# Patient Record
Sex: Male | Born: 1945 | Race: White | Hispanic: No | Marital: Single | Smoking: Former smoker
Health system: Southern US, Community
[De-identification: ages and names within clinical notes are randomized; demographics above are authoritative.]

## PROBLEM LIST (undated history)

## (undated) DIAGNOSIS — M1990 Unspecified osteoarthritis, unspecified site: Secondary | ICD-10-CM

## (undated) DIAGNOSIS — K219 Gastro-esophageal reflux disease without esophagitis: Secondary | ICD-10-CM

## (undated) DIAGNOSIS — E785 Hyperlipidemia, unspecified: Secondary | ICD-10-CM

## (undated) DIAGNOSIS — I10 Essential (primary) hypertension: Secondary | ICD-10-CM

## (undated) DIAGNOSIS — D229 Melanocytic nevi, unspecified: Secondary | ICD-10-CM

## (undated) DIAGNOSIS — K5792 Diverticulitis of intestine, part unspecified, without perforation or abscess without bleeding: Secondary | ICD-10-CM

## (undated) DIAGNOSIS — R42 Dizziness and giddiness: Secondary | ICD-10-CM

## (undated) DIAGNOSIS — N189 Chronic kidney disease, unspecified: Secondary | ICD-10-CM

## (undated) HISTORY — DX: Melanocytic nevi, unspecified: D22.9

## (undated) HISTORY — DX: Gastro-esophageal reflux disease without esophagitis: K21.9

## (undated) HISTORY — PX: APPENDECTOMY: SHX54

## (undated) HISTORY — DX: Unspecified osteoarthritis, unspecified site: M19.90

## (undated) HISTORY — DX: Chronic kidney disease, unspecified: N18.9

## (undated) HISTORY — DX: Essential (primary) hypertension: I10

## (undated) HISTORY — DX: Diverticulitis of intestine, part unspecified, without perforation or abscess without bleeding: K57.92

## (undated) HISTORY — DX: Dizziness and giddiness: R42

## (undated) HISTORY — DX: Hyperlipidemia, unspecified: E78.5

---

## 1999-01-15 ENCOUNTER — Encounter: Payer: Self-pay | Admitting: Emergency Medicine

## 1999-01-15 ENCOUNTER — Inpatient Hospital Stay (HOSPITAL_COMMUNITY): Admission: EM | Admit: 1999-01-15 | Discharge: 1999-01-16 | Payer: Self-pay | Admitting: Emergency Medicine

## 2000-08-22 ENCOUNTER — Encounter: Payer: Self-pay | Admitting: Emergency Medicine

## 2000-08-22 ENCOUNTER — Emergency Department (HOSPITAL_COMMUNITY): Admission: EM | Admit: 2000-08-22 | Discharge: 2000-08-22 | Payer: Self-pay | Admitting: Emergency Medicine

## 2001-04-07 ENCOUNTER — Encounter: Payer: Self-pay | Admitting: Neurosurgery

## 2001-04-07 ENCOUNTER — Encounter: Admission: RE | Admit: 2001-04-07 | Discharge: 2001-04-07 | Payer: Self-pay | Admitting: Neurosurgery

## 2001-05-04 ENCOUNTER — Other Ambulatory Visit: Admission: RE | Admit: 2001-05-04 | Discharge: 2001-05-04 | Payer: Self-pay | Admitting: Internal Medicine

## 2004-02-22 ENCOUNTER — Emergency Department (HOSPITAL_COMMUNITY): Admission: EM | Admit: 2004-02-22 | Discharge: 2004-02-22 | Payer: Self-pay

## 2004-11-19 ENCOUNTER — Ambulatory Visit: Payer: Self-pay | Admitting: Internal Medicine

## 2008-03-04 ENCOUNTER — Emergency Department (HOSPITAL_COMMUNITY): Admission: EM | Admit: 2008-03-04 | Discharge: 2008-03-04 | Payer: Self-pay | Admitting: Family Medicine

## 2011-06-22 DIAGNOSIS — D229 Melanocytic nevi, unspecified: Secondary | ICD-10-CM

## 2011-06-22 HISTORY — DX: Melanocytic nevi, unspecified: D22.9

## 2012-01-04 ENCOUNTER — Telehealth: Payer: Self-pay | Admitting: Internal Medicine

## 2012-01-04 NOTE — Telephone Encounter (Signed)
Pt called and wants apt same day with Dr.John.  Pt was advised he would have to re-est care due to his LOV being in 2005.  Pt stated he would try to get in with a different dr.

## 2013-03-27 ENCOUNTER — Ambulatory Visit
Admission: RE | Admit: 2013-03-27 | Discharge: 2013-03-27 | Disposition: A | Discharge status: Home / Self Care | Payer: Medicare Other | Source: Ambulatory Visit | Attending: Family Medicine | Admitting: Family Medicine

## 2013-03-27 ENCOUNTER — Other Ambulatory Visit: Payer: Self-pay | Admitting: Family Medicine

## 2013-03-27 DIAGNOSIS — R0989 Other specified symptoms and signs involving the circulatory and respiratory systems: Secondary | ICD-10-CM

## 2013-04-12 ENCOUNTER — Other Ambulatory Visit: Payer: Self-pay | Admitting: Gastroenterology

## 2014-06-14 ENCOUNTER — Encounter: Payer: Self-pay | Admitting: *Deleted

## 2014-07-09 ENCOUNTER — Other Ambulatory Visit: Payer: Self-pay | Admitting: Physician Assistant

## 2014-12-20 DIAGNOSIS — D539 Nutritional anemia, unspecified: Secondary | ICD-10-CM | POA: Diagnosis not present

## 2015-01-08 DIAGNOSIS — Z1211 Encounter for screening for malignant neoplasm of colon: Secondary | ICD-10-CM | POA: Diagnosis not present

## 2015-02-11 ENCOUNTER — Other Ambulatory Visit: Payer: Self-pay | Admitting: Physician Assistant

## 2015-02-11 DIAGNOSIS — L570 Actinic keratosis: Secondary | ICD-10-CM | POA: Diagnosis not present

## 2015-02-11 DIAGNOSIS — D485 Neoplasm of uncertain behavior of skin: Secondary | ICD-10-CM | POA: Diagnosis not present

## 2015-02-21 DIAGNOSIS — I10 Essential (primary) hypertension: Secondary | ICD-10-CM | POA: Diagnosis not present

## 2015-03-22 DIAGNOSIS — R42 Dizziness and giddiness: Secondary | ICD-10-CM | POA: Diagnosis not present

## 2015-07-02 DIAGNOSIS — E785 Hyperlipidemia, unspecified: Secondary | ICD-10-CM | POA: Diagnosis not present

## 2015-07-02 DIAGNOSIS — D649 Anemia, unspecified: Secondary | ICD-10-CM | POA: Diagnosis not present

## 2015-07-02 DIAGNOSIS — E781 Pure hyperglyceridemia: Secondary | ICD-10-CM | POA: Diagnosis not present

## 2015-07-02 DIAGNOSIS — K219 Gastro-esophageal reflux disease without esophagitis: Secondary | ICD-10-CM | POA: Diagnosis not present

## 2015-07-02 DIAGNOSIS — L989 Disorder of the skin and subcutaneous tissue, unspecified: Secondary | ICD-10-CM | POA: Diagnosis not present

## 2015-07-02 DIAGNOSIS — I10 Essential (primary) hypertension: Secondary | ICD-10-CM | POA: Diagnosis not present

## 2015-07-02 DIAGNOSIS — N183 Chronic kidney disease, stage 3 (moderate): Secondary | ICD-10-CM | POA: Diagnosis not present

## 2015-07-09 DIAGNOSIS — D2261 Melanocytic nevi of right upper limb, including shoulder: Secondary | ICD-10-CM | POA: Diagnosis not present

## 2015-07-09 DIAGNOSIS — D485 Neoplasm of uncertain behavior of skin: Secondary | ICD-10-CM | POA: Diagnosis not present

## 2015-07-09 DIAGNOSIS — D225 Melanocytic nevi of trunk: Secondary | ICD-10-CM | POA: Diagnosis not present

## 2015-07-09 DIAGNOSIS — L820 Inflamed seborrheic keratosis: Secondary | ICD-10-CM | POA: Diagnosis not present

## 2015-08-02 DIAGNOSIS — H68101 Unspecified obstruction of Eustachian tube, right ear: Secondary | ICD-10-CM | POA: Diagnosis not present

## 2015-09-15 ENCOUNTER — Other Ambulatory Visit: Payer: Self-pay | Admitting: Family Medicine

## 2015-09-15 DIAGNOSIS — R2689 Other abnormalities of gait and mobility: Secondary | ICD-10-CM

## 2015-09-18 ENCOUNTER — Ambulatory Visit
Admission: RE | Admit: 2015-09-18 | Discharge: 2015-09-18 | Disposition: A | Discharge status: Home / Self Care | Payer: Commercial Managed Care - HMO | Source: Ambulatory Visit | Attending: Family Medicine | Admitting: Family Medicine

## 2015-09-18 DIAGNOSIS — R42 Dizziness and giddiness: Secondary | ICD-10-CM | POA: Diagnosis not present

## 2015-09-18 DIAGNOSIS — R2689 Other abnormalities of gait and mobility: Secondary | ICD-10-CM

## 2015-09-22 DIAGNOSIS — R05 Cough: Secondary | ICD-10-CM | POA: Diagnosis not present

## 2015-09-22 DIAGNOSIS — K219 Gastro-esophageal reflux disease without esophagitis: Secondary | ICD-10-CM | POA: Diagnosis not present

## 2015-09-22 DIAGNOSIS — R42 Dizziness and giddiness: Secondary | ICD-10-CM | POA: Diagnosis not present

## 2015-09-22 DIAGNOSIS — M542 Cervicalgia: Secondary | ICD-10-CM | POA: Diagnosis not present

## 2015-09-22 DIAGNOSIS — H903 Sensorineural hearing loss, bilateral: Secondary | ICD-10-CM | POA: Diagnosis not present

## 2015-09-27 ENCOUNTER — Other Ambulatory Visit: Payer: Medicare Other

## 2015-10-22 ENCOUNTER — Other Ambulatory Visit: Payer: Self-pay | Admitting: Family Medicine

## 2015-10-22 DIAGNOSIS — M5412 Radiculopathy, cervical region: Secondary | ICD-10-CM

## 2015-10-26 ENCOUNTER — Ambulatory Visit
Admission: RE | Admit: 2015-10-26 | Discharge: 2015-10-26 | Disposition: A | Discharge status: Home / Self Care | Payer: Commercial Managed Care - HMO | Source: Ambulatory Visit | Attending: Family Medicine | Admitting: Family Medicine

## 2015-10-26 DIAGNOSIS — M4802 Spinal stenosis, cervical region: Secondary | ICD-10-CM | POA: Diagnosis not present

## 2015-10-26 DIAGNOSIS — M5412 Radiculopathy, cervical region: Secondary | ICD-10-CM

## 2015-11-03 ENCOUNTER — Other Ambulatory Visit: Payer: Medicare Other

## 2015-11-17 ENCOUNTER — Ambulatory Visit: Payer: Commercial Managed Care - HMO | Admitting: Neurology

## 2015-11-20 ENCOUNTER — Ambulatory Visit: Payer: Commercial Managed Care - HMO | Admitting: Neurology

## 2015-11-21 ENCOUNTER — Ambulatory Visit (INDEPENDENT_AMBULATORY_CARE_PROVIDER_SITE_OTHER): Payer: Commercial Managed Care - HMO | Admitting: Neurology

## 2015-11-21 ENCOUNTER — Encounter: Payer: Self-pay | Admitting: Neurology

## 2015-11-21 VITALS — Ht 70.0 in | Wt 189.0 lb

## 2015-11-21 DIAGNOSIS — R42 Dizziness and giddiness: Secondary | ICD-10-CM

## 2015-11-21 DIAGNOSIS — R269 Unspecified abnormalities of gait and mobility: Secondary | ICD-10-CM

## 2015-11-21 HISTORY — DX: Dizziness and giddiness: R42

## 2015-11-21 NOTE — Patient Instructions (Addendum)
  We will check MRA of the head and a carotid doppler study.   Dizziness Dizziness is a common problem. It is a feeling of unsteadiness or light-headedness. You may feel like you are about to faint. Dizziness can lead to injury if you stumble or fall. Anyone can become dizzy, but dizziness is more common in older adults. This condition can be caused by a number of things, including medicines, dehydration, or illness. HOME CARE INSTRUCTIONS Taking these steps may help with your condition: Eating and Drinking  Drink enough fluid to keep your urine clear or pale yellow. This helps to keep you from becoming dehydrated. Try to drink more clear fluids, such as water.  Do not drink alcohol.  Limit your caffeine intake if directed by your health care provider.  Limit your salt intake if directed by your health care provider. Activity  Avoid making quick movements.  Rise slowly from chairs and steady yourself until you feel okay.  In the morning, first sit up on the side of the bed. When you feel okay, stand slowly while you hold onto something until you know that your balance is fine.  Move your legs often if you need to stand in one place for a long time. Tighten and relax your muscles in your legs while you are standing.  Do not drive or operate heavy machinery if you feel dizzy.  Avoid bending down if you feel dizzy. Place items in your home so that they are easy for you to reach without leaning over. Lifestyle  Do not use any tobacco products, including cigarettes, chewing tobacco, or electronic cigarettes. If you need help quitting, ask your health care provider.  Try to reduce your stress level, such as with yoga or meditation. Talk with your health care provider if you need help. General Instructions  Watch your dizziness for any changes.  Take medicines only as directed by your health care provider. Talk with your health care provider if you think that your dizziness is caused  by a medicine that you are taking.  Tell a friend or a family member that you are feeling dizzy. If he or she notices any changes in your behavior, have this person call your health care provider.  Keep all follow-up visits as directed by your health care provider. This is important. SEEK MEDICAL CARE IF:  Your dizziness does not go away.  Your dizziness or light-headedness gets worse.  You feel nauseous.  You have reduced hearing.  You have new symptoms.  You are unsteady on your feet or you feel like the room is spinning. SEEK IMMEDIATE MEDICAL CARE IF:  You vomit or have diarrhea and are unable to eat or drink anything.  You have problems talking, walking, swallowing, or using your arms, hands, or legs.  You feel generally weak.  You are not thinking clearly or you have trouble forming sentences. It may take a friend or family member to notice this.  You have chest pain, abdominal pain, shortness of breath, or sweating.  Your vision changes.  You notice any bleeding.  You have a headache.  You have neck pain or a stiff neck.  You have a fever.   This information is not intended to replace advice given to you by your health care provider. Make sure you discuss any questions you have with your health care provider.   Document Released: 05/18/2001 Document Revised: 04/08/2015 Document Reviewed: 11/18/2014 Elsevier Interactive Patient Education [employer].
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
## 2015-11-25 ENCOUNTER — Ambulatory Visit
Admission: RE | Admit: 2015-11-25 | Discharge: 2015-11-25 | Disposition: A | Discharge status: Home / Self Care | Payer: Commercial Managed Care - HMO | Source: Ambulatory Visit | Attending: Neurology | Admitting: Neurology

## 2015-11-25 ENCOUNTER — Ambulatory Visit (HOSPITAL_COMMUNITY)
Admission: RE | Admit: 2015-11-25 | Discharge: 2015-11-25 | Disposition: A | Discharge status: Home / Self Care | Payer: Commercial Managed Care - HMO | Source: Ambulatory Visit | Attending: Neurology | Admitting: Neurology

## 2015-11-25 DIAGNOSIS — R269 Unspecified abnormalities of gait and mobility: Secondary | ICD-10-CM | POA: Diagnosis not present

## 2015-11-25 DIAGNOSIS — R42 Dizziness and giddiness: Secondary | ICD-10-CM | POA: Insufficient documentation

## 2015-11-25 DIAGNOSIS — I6523 Occlusion and stenosis of bilateral carotid arteries: Secondary | ICD-10-CM | POA: Diagnosis not present

## 2015-11-25 DIAGNOSIS — I651 Occlusion and stenosis of basilar artery: Secondary | ICD-10-CM | POA: Diagnosis not present

## 2015-11-25 NOTE — Progress Notes (Signed)
VASCULAR LAB PRELIMINARY  PRELIMINARY  PRELIMINARY  PRELIMINARY  Carotid duplex completed.    Preliminary report:  Bilateral:  1-39% ICA stenosis.  Vertebral artery flow is antegrade. However the vertebral on the right appears dampened monophasic with severely diminished velocities.     Yudith Norlander, RVS 11/25/2015, 4:01 PM

## 2015-11-26 ENCOUNTER — Telehealth: Payer: Self-pay | Admitting: Neurology

## 2015-11-26 NOTE — Telephone Encounter (Signed)
   I called patient. The carotid arteries are unremarkable, the patient appears to have diminutive flow in the right vertebral artery, he may have a dominant left vertebral artery. I will call him when the results of the MRA of the head are available.  Carotid doppler study 11/26/15:  Summary:  - Bilateral - 1% to 39 % ICA stenosis. - Right vertebral artery flow is antegrade however wave form is severely dampened with dampened velocities suggestive of distal stenosis/occlusion - Left vertebral artery flow is antegrade.

## 2015-11-27 ENCOUNTER — Telehealth: Payer: Self-pay | Admitting: Neurology

## 2015-11-27 MED ORDER — CLOPIDOGREL BISULFATE 75 MG PO TABS: 75.0000 mg | ORAL_TABLET | Freq: Every day | ORAL | Status: DC

## 2015-11-27 NOTE — Telephone Encounter (Signed)
I called the patient. The MRA shows a dominant left vertebral artery, the right vertebral artery does not connect with the basilar. There appears to be a small basal artery causes of the fact that the posterior cerebral arteries both come off of the anterior circulation. However, there is atherosclerotic changes and basilar stenosis seen that may be symptomatic. There is also mid left posterior cerebral artery stenosis associated with atherosclerosis. The patient is on blood pressure medications, being overly aggressive in treatment may result in symptoms. He says he normally runs around [id number] systolic. If he becomes symptomatic, he is to check his blood pressure, if it is lower than normal, he is to allow the blood pressure to go higher. His cholesterol panel needs to be checked regularly, and if the LDL is greater than 70, his cholesterol should be treated. I will add Plavix to the aspirin over the next 90 days. We will follow the patient over time.   MRA head 11/26/15:  IMPRESSION:  Abnormal MRA head (without) demonstrating: 1. 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 A small segment of the distal basal artery continues and transitions into the left posterior cerebral artery. 2. 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 3. Left vertebral artery is dominant, and supplies the basilar artery. Mild atherosclerotic irregularities of the distal left vertebral and proximal basilar artery. Right vertebral artery is hypoplastic.

## 2015-11-29 ENCOUNTER — Other Ambulatory Visit: Payer: Medicare Other

## 2015-12-12 ENCOUNTER — Ambulatory Visit (HOSPITAL_BASED_OUTPATIENT_CLINIC_OR_DEPARTMENT_OTHER): Payer: Commercial Managed Care - HMO

## 2016-01-24 DIAGNOSIS — M5412 Radiculopathy, cervical region: Secondary | ICD-10-CM | POA: Diagnosis not present

## 2016-01-30 DIAGNOSIS — M47812 Spondylosis without myelopathy or radiculopathy, cervical region: Secondary | ICD-10-CM | POA: Diagnosis not present

## 2016-01-30 DIAGNOSIS — M5412 Radiculopathy, cervical region: Secondary | ICD-10-CM | POA: Diagnosis not present

## 2016-02-10 ENCOUNTER — Ambulatory Visit (INDEPENDENT_AMBULATORY_CARE_PROVIDER_SITE_OTHER)
Admission: RE | Admit: 2016-02-10 | Discharge: 2016-02-10 | Disposition: A | Discharge status: Home / Self Care | Payer: Commercial Managed Care - HMO | Source: Ambulatory Visit | Attending: Internal Medicine | Admitting: Internal Medicine

## 2016-02-10 ENCOUNTER — Ambulatory Visit (INDEPENDENT_AMBULATORY_CARE_PROVIDER_SITE_OTHER): Payer: Commercial Managed Care - HMO | Admitting: Internal Medicine

## 2016-02-10 ENCOUNTER — Encounter: Payer: Self-pay | Admitting: Internal Medicine

## 2016-02-10 VITALS — BP 126/84 | HR 71 | Ht 70.0 in | Wt 188.0 lb

## 2016-02-10 DIAGNOSIS — R05 Cough: Secondary | ICD-10-CM | POA: Insufficient documentation

## 2016-02-10 DIAGNOSIS — R058 Other specified cough: Secondary | ICD-10-CM

## 2016-02-10 MED ORDER — TRAMADOL HCL 50 MG PO TABS: ORAL_TABLET | ORAL | Status: DC

## 2016-02-10 MED ORDER — FAMOTIDINE 20 MG PO TABS: ORAL_TABLET | ORAL | Status: DC

## 2016-02-10 MED ORDER — PANTOPRAZOLE SODIUM 40 MG PO TBEC: 40.0000 mg | DELAYED_RELEASE_TABLET | Freq: Every day | ORAL | Status: AC

## 2016-02-10 MED ORDER — METHYLPREDNISOLONE ACETATE 80 MG/ML IJ SUSP
120.0000 mg | Freq: Once | INTRAMUSCULAR | Status: AC
  Administered 2016-02-10: 120 mg via INTRAMUSCULAR

## 2016-02-10 NOTE — Patient Instructions (Addendum)
The key to effective treatment for your cough is eliminating the non-stop cycle of cough you're stuck in long enough to let your airway heal completely and then see if there is anything still making you cough once you stop the cough suppression, but this should take no more than 5 days to figure out  First take delsym two tsp every 12 hours and supplement if needed with  tramadol 50 mg up to 2 every 4 hours to suppress the urge to cough at all or even clear your throat. Swallowing water or using ice chips/non mint and menthol containing candies (such as lifesavers or sugarless jolly ranchers) are also effective.  You should rest your voice and avoid activities that you know make you cough.  Once you have eliminated the cough for 3 straight days try reducing the tramadol first,  then the delsym as tolerated.    Depomedrol 120 mg IM today   Protonix (pantoprazole) 40 Take 30-60 min before first meal of the day and Pepcid 20 mg one bedtime plus chlorpheniramine 4 mg x 2 at bedtime (both available over the counter)  until cough is completely gone for at least a week without the need for cough suppression  For drainage / throat tickle try take CHLORPHENIRAMINE  4 mg - take one every 4 hours as needed - available over the counter- may cause drowsiness so start with just a bedtime dose or two and see how you tolerate it before trying in daytime    GERD (REFLUX)  is an extremely common cause of respiratory symptoms, many times with no significant heartburn at all.    It can be treated with medication, but also with lifestyle changes including avoidance of late meals, excessive alcohol, smoking cessation, and avoid fatty foods, chocolate, peppermint, colas, red wine, and acidic juices such as orange juice.  NO MINT OR MENTHOL PRODUCTS SO NO COUGH DROPS  USE HARD CANDY INSTEAD (jolley ranchers or Stover's or Lifesavers (all available in sugarless versions) NO OIL BASED VITAMINS - use powdered  substitutes.  Please remember to go to the x-ray department downstairs for your tests - we will call you with the results when they are available.       return in 4 weeks if not all better

## 2016-02-10 NOTE — Progress Notes (Signed)
Subjective:    Patient ID: Jeffrey Wong, male    DOB: 18-Jun-1946,    MRN: [id number]  HPI  46 yowm quit smoking 1985 onset of recurrent bronchitis p quit smoking occurred  up to 3 x year since the 1990s faded away s needing chronic rx then onset around 2013/2014 persistent daily cough/ urge to clear throat > [place] eval dx gerd min better > Vernal allergy to mold > no better on pred but only took a few days due to cramps and referred to pulmonary clinic 02/10/2016 by Dr Samara Snide    02/10/2016 1st [facility] office visit/ Khalin Royce   Chief Complaint  Patient presents with  . Pulmonary Consult    Referred by Dr. Kenton Kingfisher. Pt c/o cough, congestion and wheezing "for years" worse recently. He states symptoms are worse after eating. He has to clear his throat often.   onset was insidious and now daily sporadic pattern of cough > most mucus production  is after supper and less while sleeping s am flare on waking or any noct symptoms     No obvious other patterns in day to day or daytime variabilty or assoc chronic cough or cp or chest tightness, subjective wheeze overt sinus or hb symptoms but does exp dyspepsia taking prn ppi and lots of oil based supplements   No unusual exp hx or h/o childhood pna/ asthma or knowledge of premature birth.  Sleeping ok without nocturnal  or early am exacerbation  of respiratory  c/o's or need for noct saba. Also denies any obvious fluctuation of symptoms with weather or environmental changes or other aggravating or alleviating factors except as outlined above   Current Medications, Allergies, Complete Past Medical History, Past Surgical History, Family History, and Social History were reviewed in [employer] record.             Review of Systems  Constitutional: Negative for fever, chills, activity change, appetite change and unexpected weight change.  HENT: Negative for congestion, dental problem, postnasal drip, rhinorrhea,  sneezing, sore throat, trouble swallowing and voice change.   Eyes: Negative for visual disturbance.  Respiratory: Positive for cough. Negative for choking and shortness of breath.   Cardiovascular: Negative for chest pain and leg swelling.  Gastrointestinal: Negative for nausea, vomiting and abdominal pain.  Genitourinary: Negative for difficulty urinating.       Indigestion  Musculoskeletal: Negative for arthralgias.  Skin: Negative for rash.  Psychiatric/Behavioral: Negative for behavioral problems and confusion.       Objective:   Physical Exam  amb wm nad with freq throat clearing   Wt Readings from Last 3 Encounters:  02/10/16 188 lb (85.276 kg)  11/21/15 189 lb (85.73 kg)    Vital signs reviewed    HEENT: nl dentition, turbinates, and oropharynx. Nl external ear canals without cough reflex   NECK :  without JVD/Nodes/TM/ nl carotid upstrokes bilaterally   LUNGS: no acc muscle use,  Nl contour chest which is clear to A and P bilaterally without cough on insp or exp maneuvers   CV:  RRR  no s3 or murmur or increase in P2, no edema   ABD:  soft and nontender with nl inspiratory excursion in the supine position. No bruits or organomegaly, bowel sounds nl  MS:  Nl gait/ ext warm without deformities, calf tenderness, cyanosis or clubbing No obvious joint restrictions   SKIN: warm and dry without lesions    NEURO:  alert, approp, nl sensorium  with  no motor deficits    CXR PA and Lateral:   02/10/2016 :    I personally reviewed images and agree with radiology impression as follows:   No abnormalities        Assessment & Plan:

## 2016-02-11 ENCOUNTER — Encounter: Payer: Self-pay | Admitting: Internal Medicine

## 2016-02-11 NOTE — Progress Notes (Signed)
Quick Note:  Spoke with pt and notified of results per Dr. Wert. Pt verbalized understanding and denied any questions.  ______ 

## 2016-02-11 NOTE — Assessment & Plan Note (Signed)
The most common causes of chronic cough in immunocompetent adults include the following: upper airway cough syndrome (UACS), previously referred to as postnasal drip syndrome (PNDS), which is caused by variety of rhinosinus conditions; (2) asthma; (3) GERD; (4) chronic bronchitis from cigarette smoking or other inhaled environmental irritants; (5) nonasthmatic eosinophilic bronchitis; and (6) bronchiectasis.   These conditions, singly or in combination, have accounted for up to 94% of the causes of chronic cough in prospective studies.   Other conditions have constituted no >6% of the causes in prospective studies These have included bronchogenic carcinoma, chronic interstitial pneumonia, sarcoidosis, left ventricular failure, ACEI-induced cough, and aspiration from a condition associated with pharyngeal dysfunction.    Chronic cough is often simultaneously caused by more than one condition. A single cause has been found from 38 to 82% of the time, multiple causes from 18 to 62%. Multiply caused cough has been the result of three diseases up to 42% of the time.       Based on hx and exam, this is most likely:  Classic Upper airway cough syndrome, so named because it's frequently impossible to sort out how much is  CR/sinusitis with freq throat clearing (which can be related to primary GERD)   vs  causing  secondary (" extra esophageal")  GERD from wide swings in gastric pressure that occur with throat clearing, often  promoting self use of mint and menthol lozenges that reduce the lower esophageal sphincter tone and exacerbate the problem further in a cyclical fashion.   These are the same pts (now being labeled as having "irritable larynx syndrome" by some cough centers) who not infrequently have a history of having failed to tolerate ace inhibitors,  dry powder inhalers or biphosphonates or report having atypical reflux symptoms that don't respond to standard doses of PPI , and are easily confused as  having aecopd or asthma flares by even experienced allergists/ pulmonologists.   The first step is to maximize all aspects of gerd rx  and eliminate cyclical coughing then regroup if the cough persists.  I had an extended discussion with the patient reviewing all relevant studies completed to date and  lasting 35 min  1) Explained: The standardized cough guidelines published in Chest by Lissa Morales in 2006 are still the best available and consist of a multiple step process (up to 12!) , not a single office visit,  and are intended  to address this problem logically,  with an alogrithm dependent on response to empiric treatment at  each progressive step  to determine a specific diagnosis with  minimal addtional testing needed. Therefore if adherence is an issue or can't be accurately verified,  it's very unlikely the standard evaluation and treatment will be successful here.    Furthermore, response to therapy (other than acute cough suppression, which should only be used short term with avoidance of narcotic containing cough syrups if possible), can be a gradual process for which the patient may perceive immediate benefit.  Unlike going to an eye doctor where the best perscription is almost always the first one and is immediately effective, this is almost never the case in the management of chronic cough syndromes. Therefore the patient needs to commit up front to consistently adhere to recommendations  for up to 6 weeks of therapy directed at the likely underlying problem(s) before the response can be reasonably evaluated.     2) Each maintenance medication was reviewed in detail including most importantly the difference between maintenance and  prns and under what circumstances the prns are to be triggered using an action plan format that is not reflected in the computer generated alphabetically organized AVS.    Please see instructions for details which were reviewed in writing and the patient  given a copy highlighting the part that I personally wrote and discussed at today's ov.   See instructions for specific recommendations which were reviewed directly with the patient who was given a copy with highlighter outlining the key components.

## 2016-03-15 ENCOUNTER — Ambulatory Visit: Payer: Commercial Managed Care - HMO | Admitting: Internal Medicine

## 2016-04-15 ENCOUNTER — Other Ambulatory Visit: Payer: Self-pay | Admitting: Physician Assistant

## 2016-04-15 DIAGNOSIS — D367 Benign neoplasm of other specified sites: Secondary | ICD-10-CM | POA: Diagnosis not present

## 2016-04-15 DIAGNOSIS — D239 Other benign neoplasm of skin, unspecified: Secondary | ICD-10-CM | POA: Diagnosis not present

## 2016-04-15 DIAGNOSIS — L570 Actinic keratosis: Secondary | ICD-10-CM | POA: Diagnosis not present

## 2016-04-15 DIAGNOSIS — D485 Neoplasm of uncertain behavior of skin: Secondary | ICD-10-CM | POA: Diagnosis not present

## 2016-04-15 DIAGNOSIS — L821 Other seborrheic keratosis: Secondary | ICD-10-CM | POA: Diagnosis not present

## 2016-04-30 DIAGNOSIS — Z0100 Encounter for examination of eyes and vision without abnormal findings: Secondary | ICD-10-CM | POA: Diagnosis not present

## 2016-04-30 DIAGNOSIS — H04123 Dry eye syndrome of bilateral lacrimal glands: Secondary | ICD-10-CM | POA: Diagnosis not present

## 2016-07-22 DIAGNOSIS — D649 Anemia, unspecified: Secondary | ICD-10-CM | POA: Diagnosis not present

## 2016-07-22 DIAGNOSIS — I10 Essential (primary) hypertension: Secondary | ICD-10-CM | POA: Diagnosis not present

## 2016-07-22 DIAGNOSIS — N183 Chronic kidney disease, stage 3 (moderate): Secondary | ICD-10-CM | POA: Diagnosis not present

## 2016-07-22 DIAGNOSIS — E785 Hyperlipidemia, unspecified: Secondary | ICD-10-CM | POA: Diagnosis not present

## 2016-07-22 DIAGNOSIS — Z125 Encounter for screening for malignant neoplasm of prostate: Secondary | ICD-10-CM | POA: Diagnosis not present

## 2016-08-23 DIAGNOSIS — H02401 Unspecified ptosis of right eyelid: Secondary | ICD-10-CM | POA: Diagnosis not present

## 2016-08-23 DIAGNOSIS — H02403 Unspecified ptosis of bilateral eyelids: Secondary | ICD-10-CM | POA: Diagnosis not present

## 2016-08-23 DIAGNOSIS — H02402 Unspecified ptosis of left eyelid: Secondary | ICD-10-CM | POA: Diagnosis not present

## 2016-09-21 DIAGNOSIS — A084 Viral intestinal infection, unspecified: Secondary | ICD-10-CM | POA: Diagnosis not present

## 2016-10-06 ENCOUNTER — Other Ambulatory Visit: Payer: Self-pay | Admitting: Physician Assistant

## 2016-10-06 DIAGNOSIS — L570 Actinic keratosis: Secondary | ICD-10-CM | POA: Diagnosis not present

## 2016-10-06 DIAGNOSIS — L820 Inflamed seborrheic keratosis: Secondary | ICD-10-CM | POA: Diagnosis not present

## 2016-10-06 DIAGNOSIS — D485 Neoplasm of uncertain behavior of skin: Secondary | ICD-10-CM | POA: Diagnosis not present

## 2016-10-06 DIAGNOSIS — D3612 Benign neoplasm of peripheral nerves and autonomic nervous system, upper limb, including shoulder: Secondary | ICD-10-CM | POA: Diagnosis not present

## 2016-12-10 DIAGNOSIS — L908 Other atrophic disorders of skin: Secondary | ICD-10-CM | POA: Diagnosis not present

## 2017-01-24 DIAGNOSIS — I10 Essential (primary) hypertension: Secondary | ICD-10-CM | POA: Diagnosis not present

## 2017-01-24 DIAGNOSIS — E781 Pure hyperglyceridemia: Secondary | ICD-10-CM | POA: Diagnosis not present

## 2017-01-24 DIAGNOSIS — N183 Chronic kidney disease, stage 3 (moderate): Secondary | ICD-10-CM | POA: Diagnosis not present

## 2017-01-24 DIAGNOSIS — K219 Gastro-esophageal reflux disease without esophagitis: Secondary | ICD-10-CM | POA: Diagnosis not present

## 2017-01-24 DIAGNOSIS — J309 Allergic rhinitis, unspecified: Secondary | ICD-10-CM | POA: Diagnosis not present

## 2017-02-04 DIAGNOSIS — R739 Hyperglycemia, unspecified: Secondary | ICD-10-CM | POA: Diagnosis not present

## 2017-03-02 DIAGNOSIS — L908 Other atrophic disorders of skin: Secondary | ICD-10-CM | POA: Diagnosis not present

## 2017-03-10 DIAGNOSIS — L908 Other atrophic disorders of skin: Secondary | ICD-10-CM | POA: Diagnosis not present

## 2017-03-10 DIAGNOSIS — H02403 Unspecified ptosis of bilateral eyelids: Secondary | ICD-10-CM | POA: Diagnosis not present

## 2017-05-11 ENCOUNTER — Encounter (INDEPENDENT_AMBULATORY_CARE_PROVIDER_SITE_OTHER): Payer: Self-pay | Admitting: Orthopedic Surgery

## 2017-05-11 ENCOUNTER — Ambulatory Visit (INDEPENDENT_AMBULATORY_CARE_PROVIDER_SITE_OTHER): Payer: Medicare HMO | Admitting: Orthopedic Surgery

## 2017-05-11 ENCOUNTER — Ambulatory Visit (INDEPENDENT_AMBULATORY_CARE_PROVIDER_SITE_OTHER): Payer: Medicare HMO

## 2017-05-11 DIAGNOSIS — M25512 Pain in left shoulder: Secondary | ICD-10-CM | POA: Diagnosis not present

## 2017-05-13 NOTE — Progress Notes (Signed)
Office Visit Note   Patient: Jeffrey Wong           Date of Birth: 1946/02/06           MRN: [id number] Visit Date: 05/11/2017 Requested by: Shirline Frees, MD [address], [place] [address] PCP: Shirline Frees, MD  Subjective: Chief Complaint  Patient presents with  . Left Shoulder - Pain    HPI: [name] is a active here.  71 year old patient with left shoulder pain.  He works out a lot.  He injured his shoulder 3 weeks ago lifting weights.  He is right-hand dominant.  Does not report any pain at rest.  Does describe weakness and decreased range of motion.  Takes Aleve for pain.  The pain will wake him from sleep at night.  No previous injuries or surgeries.  This patient is retired              ROS: All systems reviewed are negative as they relate to the chief complaint within the history of present illness.  Patient denies  fevers or chills.   Assessment & Plan: Visit Diagnoses:  1. Acute pain of left shoulder     Plan: Impression is classic exam and history for rotator cuff tear.  In order for him to move his arm overhead he has to avoid the forward flexed position because of weakness.  He's got some coarseness and grinding his passive range of motion as well.  He works out 6 days a week.  Plan is for MRI arthrogram left shoulder evaluate rotator cuff tear will see him back after that study  Follow-Up Instructions: Return for after MRI.   Orders:  Orders Placed This Encounter  Procedures  . XR Shoulder Left  . MR Shoulder Left w/ contrast  . Arthrogram   No orders of the defined types were placed in this encounter.     Procedures: No procedures performed   Clinical Data: No additional findings.  Objective: Vital Signs: There were no vitals taken for this visit.  Physical Exam:   Constitutional: Patient appears well-developed HEENT:  Head: Normocephalic Eyes:EOM are normal Neck: Normal range of motion Cardiovascular: Normal  rate Pulmonary/chest: Effort normal Neurologic: Patient is alert Skin: Skin is warm Psychiatric: Patient has normal mood and affect    Ortho Exam: Orthopedic exam demonstrates very physically fit patient for his age group.  For him to forward flex his arm he has to bring his elbow and arm across his body keeping it close to the body in order to get it up above 90 of forward flexion.  He does have weakness to forward flexion and abduction.  This is on the left-hand side.  No acromioclavicular joint tenderness.  No other masses lymph adenopathy or skin changes noted in the shoulder girdle region.  No Popeye deformity present.  Specialty Comments:  No specialty comments available.  Imaging: No results found.   PMFS History: Patient Active Problem List   Diagnosis Date Noted  . Upper airway cough syndrome 02/10/2016  . Vertigo 11/21/2015   Past Medical History:  Diagnosis Date  . Arthritis   . Diverticulitis   . DJD (degenerative joint disease)   . Esophageal reflux   . Essential hypertension, benign   . Other and unspecified hyperlipidemia   . Vertigo 11/21/2015    Family History  Problem Relation Age of Onset  . Hyperlipidemia Father   . Hyperlipidemia Mother   . Hypertension Mother   .  Hyperlipidemia Sister   . Hypertension Sister   . Heart disease Mother   . Other Father        tumor  . Brain cancer Father     Past Surgical History:  Procedure Laterality Date  . APPENDECTOMY     Social History   Occupational History  . Retired Other   Social History Main Topics  . Smoking status: Former Smoker    Packs/day: 1.00    Years: 15.00    Types: Cigarettes    Quit date: 12/07/1983  . Smokeless tobacco: Never Used  . Alcohol use 0.0 oz/week     Comment: Rarely  . Drug use: No  . Sexual activity: Not on file

## 2017-06-07 ENCOUNTER — Ambulatory Visit
Admission: RE | Admit: 2017-06-07 | Discharge: 2017-06-07 | Disposition: A | Discharge status: Home / Self Care | Payer: Medicare HMO | Source: Ambulatory Visit | Attending: Orthopedic Surgery | Admitting: Orthopedic Surgery

## 2017-06-07 DIAGNOSIS — M25512 Pain in left shoulder: Secondary | ICD-10-CM | POA: Diagnosis not present

## 2017-06-07 DIAGNOSIS — S46012A Strain of muscle(s) and tendon(s) of the rotator cuff of left shoulder, initial encounter: Secondary | ICD-10-CM | POA: Diagnosis not present

## 2017-06-07 MED ORDER — IOPAMIDOL (ISOVUE-M 200) INJECTION 41%
15.0000 mL | Freq: Once | INTRAMUSCULAR | Status: AC
  Administered 2017-06-07: 15 mL via INTRA_ARTICULAR

## 2017-06-15 ENCOUNTER — Ambulatory Visit (INDEPENDENT_AMBULATORY_CARE_PROVIDER_SITE_OTHER): Payer: Medicare HMO | Admitting: Orthopedic Surgery

## 2017-06-15 ENCOUNTER — Encounter (INDEPENDENT_AMBULATORY_CARE_PROVIDER_SITE_OTHER): Payer: Self-pay | Admitting: Orthopedic Surgery

## 2017-06-15 DIAGNOSIS — M75122 Complete rotator cuff tear or rupture of left shoulder, not specified as traumatic: Secondary | ICD-10-CM | POA: Diagnosis not present

## 2017-06-15 NOTE — Progress Notes (Signed)
Office Visit Note   Patient: Jeffrey Wong           Date of Birth: [birth year]           MRN: [id number] Visit Date: 06/15/2017 Requested by: Shirline Frees, MD [address], [place] [address] PCP: Shirline Frees, MD  Subjective: Chief Complaint  Patient presents with  . Left Shoulder - Follow-up    HPI: Jeffrey Wong is a 71 year old active patient with left shoulder pain.  Since of series had an MRI scan.  MRI scan is reviewed and it does show a retracted supraspinatus rotator cuff tear.  The patient is having symptoms of weakness and pain due to this left shoulder injury.  He does like to lift weights.  He stays very active.  He is otherwise healthy.              ROS: All systems reviewed are negative as they relate to the chief complaint within the history of present illness.  Patient denies  fevers or chills.   Assessment & Plan: Visit Diagnoses: No diagnosis found.  Plan: Impression is left shoulder pain with rotator cuff tear possible biceps tendon problem.  Plan is arthroscopy with subacromial decompression and mini open rotator cuff tear repair.  Risks and benefits are discussed including but limited to infection or vessel damage shoulder stiffness as well as prolonged recovery particularly to get back to kind of strengthening exercises he wants to do.  I would like to use CPM machine for the shoulder or the special abduction splint with motion for him postoperatively.  I'll see him back 7 days after surgery.  All questions answered  Follow-Up Instructions: No Follow-up on file.   Orders:  No orders of the defined types were placed in this encounter.  No orders of the defined types were placed in this encounter.     Procedures: No procedures performed   Clinical Data: No additional findings.  Objective: Vital Signs: There were no vitals taken for this visit.  Physical Exam:   Constitutional: Patient appears well-developed HEENT:  Head:  Normocephalic Eyes:EOM are normal Neck: Normal range of motion Cardiovascular: Normal rate Pulmonary/chest: Effort normal Neurologic: Patient is alert Skin: Skin is warm Psychiatric: Patient has normal mood and affect    Ortho Exam: Orthopedic exam demonstrates some weakness with forward flexion and abduction on that left arm.  Radial pulses intact bilaterally cervical spine range of motion is full no other masses lymph adenopathy or skin changes noted in the shoulder girdle region.  Specialty Comments:  No specialty comments available.  Imaging: No results found.   PMFS History: Patient Active Problem List   Diagnosis Date Noted  . Upper airway cough syndrome 02/10/2016  . Vertigo 11/21/2015   Past Medical History:  Diagnosis Date  . Arthritis   . Diverticulitis   . DJD (degenerative joint disease)   . Esophageal reflux   . Essential hypertension, benign   . Other and unspecified hyperlipidemia   . Vertigo 11/21/2015    Family History  Problem Relation Age of Onset  . Hyperlipidemia Father   . Hyperlipidemia Mother   . Hypertension Mother   . Hyperlipidemia Sister   . Hypertension Sister   . Heart disease Mother   . Other Father        tumor  . Brain cancer Father     Past Surgical History:  Procedure Laterality Date  . APPENDECTOMY     Social History   Occupational  History  . Retired Other   Social History Main Topics  . Smoking status: Former Smoker    Packs/day: 1.00    Years: 15.00    Types: Cigarettes    Quit date: 12/07/1983  . Smokeless tobacco: Never Used  . Alcohol use 0.0 oz/week     Comment: Rarely  . Drug use: No  . Sexual activity: Not on file

## 2017-06-16 ENCOUNTER — Telehealth (INDEPENDENT_AMBULATORY_CARE_PROVIDER_SITE_OTHER): Payer: Self-pay | Admitting: Orthopedic Surgery

## 2017-06-16 NOTE — Telephone Encounter (Signed)
LVM with pt to please call to schedule his surgery. Will try pt again at a later time.

## 2017-06-21 ENCOUNTER — Encounter (INDEPENDENT_AMBULATORY_CARE_PROVIDER_SITE_OTHER): Payer: Self-pay | Admitting: Orthopedic Surgery

## 2017-07-06 DIAGNOSIS — M75121 Complete rotator cuff tear or rupture of right shoulder, not specified as traumatic: Secondary | ICD-10-CM | POA: Diagnosis not present

## 2017-07-11 ENCOUNTER — Telehealth (INDEPENDENT_AMBULATORY_CARE_PROVIDER_SITE_OTHER): Payer: Self-pay

## 2017-07-11 ENCOUNTER — Encounter (INDEPENDENT_AMBULATORY_CARE_PROVIDER_SITE_OTHER): Payer: Self-pay

## 2017-07-11 ENCOUNTER — Telehealth (INDEPENDENT_AMBULATORY_CARE_PROVIDER_SITE_OTHER): Payer: Self-pay | Admitting: Radiology

## 2017-07-11 DIAGNOSIS — M66822 Spontaneous rupture of other tendons, left upper arm: Secondary | ICD-10-CM | POA: Diagnosis not present

## 2017-07-11 DIAGNOSIS — S43432D Superior glenoid labrum lesion of left shoulder, subsequent encounter: Secondary | ICD-10-CM | POA: Diagnosis not present

## 2017-07-11 DIAGNOSIS — M75122 Complete rotator cuff tear or rupture of left shoulder, not specified as traumatic: Secondary | ICD-10-CM | POA: Diagnosis not present

## 2017-07-11 DIAGNOSIS — M659 Synovitis and tenosynovitis, unspecified: Secondary | ICD-10-CM | POA: Diagnosis not present

## 2017-07-11 DIAGNOSIS — G8918 Other acute postprocedural pain: Secondary | ICD-10-CM | POA: Diagnosis not present

## 2017-07-11 DIAGNOSIS — M7522 Bicipital tendinitis, left shoulder: Secondary | ICD-10-CM | POA: Diagnosis not present

## 2017-07-11 NOTE — Telephone Encounter (Signed)
Pharmacy called wanting to know would it be okay to change directions for Rx for Oxycodone to a 7 day supply with a quantity of 42.  Stated that patient was aware that Rx would need to be changed.  Cb# is [phone].  Please advise.  Thank You.

## 2017-07-11 NOTE — Telephone Encounter (Signed)
Pharmacy called and said that the Rx written for oxycodone 5mg  is written for too much, it is above the [facility] policy for narcotic Rx's.  Ok per [employer] to change instructions to 1-2 po q 8 hr, I advised pharmacy.

## 2017-07-11 NOTE — Telephone Encounter (Signed)
PLEASE CALL PT REGARDING PAIN MEDS HE CANNOT GET FROM PHARMACY   [phone]

## 2017-07-11 NOTE — Telephone Encounter (Signed)
Pharmacy called triage phone and Abigail Butts was addressing.

## 2017-07-18 ENCOUNTER — Encounter (INDEPENDENT_AMBULATORY_CARE_PROVIDER_SITE_OTHER): Payer: Self-pay | Admitting: Family

## 2017-07-18 ENCOUNTER — Ambulatory Visit (INDEPENDENT_AMBULATORY_CARE_PROVIDER_SITE_OTHER): Payer: Medicare HMO | Admitting: Family

## 2017-07-18 DIAGNOSIS — M75122 Complete rotator cuff tear or rupture of left shoulder, not specified as traumatic: Secondary | ICD-10-CM

## 2017-07-18 NOTE — Progress Notes (Signed)
   Post-Op Visit Note   Patient: Jeffrey Wong           Date of Birth: [birth year]           MRN: [id number] Visit Date: 07/18/2017 PCP: Shirline Frees, MD  Chief Complaint:  Chief Complaint  Patient presents with  . Left Shoulder - Pain    HPI:  The patient is a 71 year old gentleman seen today 1 week status post RC repair with arthroscopy of the left shoulder. Has been in sling. Has been using ice. Is doing ROM as ordered tid.     Ortho Exam Incisions well healed. Sutures remain in place. No drainage, erythema or odor. No sign of infection. Some distal ecchymosis.   Visit Diagnoses:  1. Complete tear of left rotator cuff     Plan: Will refer to [facility] and have them begin shoulder PT next week per Dr. Forbes Cellar protocol. Sutures harvested today. May shower. Continue dry dressings until healed. Follow with dean in 3 weeks.   Follow-Up Instructions: Return in about 3 weeks (around 08/08/2017).   Imaging: No results found.  Orders:  No orders of the defined types were placed in this encounter.  No orders of the defined types were placed in this encounter.    PMFS History: Patient Active Problem List   Diagnosis Date Noted  . Complete tear of left rotator cuff 07/18/2017  . Upper airway cough syndrome 02/10/2016  . Vertigo 11/21/2015   Past Medical History:  Diagnosis Date  . Arthritis   . Diverticulitis   . DJD (degenerative joint disease)   . Esophageal reflux   . Essential hypertension, benign   . Other and unspecified hyperlipidemia   . Vertigo 11/21/2015    Family History  Problem Relation Age of Onset  . Hyperlipidemia Mother   . Hypertension Mother   . Heart disease Mother   . Hyperlipidemia Father   . Other Father        tumor  . Brain cancer Father   . Hyperlipidemia Sister   . Hypertension Sister     Past Surgical History:  Procedure Laterality Date  . APPENDECTOMY     Social History   Occupational History  . Retired Other    Social History Main Topics  . Smoking status: Former Smoker    Packs/day: 1.00    Years: 15.00    Types: Cigarettes    Quit date: 12/07/1983  . Smokeless tobacco: Never Used  . Alcohol use 0.0 oz/week     Comment: Rarely  . Drug use: No  . Sexual activity: Not on file

## 2017-07-25 DIAGNOSIS — M75122 Complete rotator cuff tear or rupture of left shoulder, not specified as traumatic: Secondary | ICD-10-CM | POA: Diagnosis not present

## 2017-07-27 DIAGNOSIS — K219 Gastro-esophageal reflux disease without esophagitis: Secondary | ICD-10-CM | POA: Diagnosis not present

## 2017-07-27 DIAGNOSIS — J3089 Other allergic rhinitis: Secondary | ICD-10-CM | POA: Diagnosis not present

## 2017-07-27 DIAGNOSIS — E7800 Pure hypercholesterolemia, unspecified: Secondary | ICD-10-CM | POA: Diagnosis not present

## 2017-07-27 DIAGNOSIS — Z125 Encounter for screening for malignant neoplasm of prostate: Secondary | ICD-10-CM | POA: Diagnosis not present

## 2017-07-27 DIAGNOSIS — I10 Essential (primary) hypertension: Secondary | ICD-10-CM | POA: Diagnosis not present

## 2017-07-27 DIAGNOSIS — R7303 Prediabetes: Secondary | ICD-10-CM | POA: Diagnosis not present

## 2017-07-27 DIAGNOSIS — Z1389 Encounter for screening for other disorder: Secondary | ICD-10-CM | POA: Diagnosis not present

## 2017-07-27 DIAGNOSIS — N183 Chronic kidney disease, stage 3 (moderate): Secondary | ICD-10-CM | POA: Diagnosis not present

## 2017-07-28 DIAGNOSIS — M75122 Complete rotator cuff tear or rupture of left shoulder, not specified as traumatic: Secondary | ICD-10-CM | POA: Diagnosis not present

## 2017-08-03 ENCOUNTER — Ambulatory Visit (INDEPENDENT_AMBULATORY_CARE_PROVIDER_SITE_OTHER): Payer: Medicare HMO | Admitting: Orthopedic Surgery

## 2017-08-03 ENCOUNTER — Encounter (INDEPENDENT_AMBULATORY_CARE_PROVIDER_SITE_OTHER): Payer: Self-pay | Admitting: Orthopedic Surgery

## 2017-08-03 DIAGNOSIS — M75122 Complete rotator cuff tear or rupture of left shoulder, not specified as traumatic: Secondary | ICD-10-CM

## 2017-08-04 NOTE — Progress Notes (Signed)
   Post-Op Visit Note   Patient: Jeffrey Wong           Date of Birth: 01-23-1946           MRN: [id number] Visit Date: 08/03/2017 PCP: Shirline Frees, MD   Assessment & Plan:  Chief Complaint:  Chief Complaint  Patient presents with  . Left Shoulder - Routine Post Op   Visit Diagnoses: No diagnosis found.  Plan: Jeffrey Wong is a 71 year old patient who is now 4 weeks out rotator cuff repair and arthroscopy.  Patient is doing some better.  Once to physical therapy twice.  Has CPM machine.  He is not taking much pain medicine.  On exam he has improving range of motion.  Does not feel like he has ruptured his repaired rotator cuff.  He is doing a little bit too much in terms of active range of motion.  Like for him to do passive range of motion only for the next 2 weeks and then start strengthening.  That is provided a prescription today.  I'll see him back in 6 weeks for recheck.  Follow-Up Instructions: Return in about 6 weeks (around 09/14/2017).   Orders:  No orders of the defined types were placed in this encounter.  No orders of the defined types were placed in this encounter.   Imaging: No results found.  PMFS History: Patient Active Problem List   Diagnosis Date Noted  . Complete tear of left rotator cuff 07/18/2017  . Upper airway cough syndrome 02/10/2016  . Vertigo 11/21/2015   Past Medical History:  Diagnosis Date  . Arthritis   . Diverticulitis   . DJD (degenerative joint disease)   . Esophageal reflux   . Essential hypertension, benign   . Other and unspecified hyperlipidemia   . Vertigo 11/21/2015    Family History  Problem Relation Age of Onset  . Hyperlipidemia Mother   . Hypertension Mother   . Heart disease Mother   . Hyperlipidemia Father   . Other Father        tumor  . Brain cancer Father   . Hyperlipidemia Sister   . Hypertension Sister     Past Surgical History:  Procedure Laterality Date  . APPENDECTOMY     Social History    Occupational History  . Retired Other   Social History Main Topics  . Smoking status: Former Smoker    Packs/day: 1.00    Years: 15.00    Types: Cigarettes    Quit date: 12/07/1983  . Smokeless tobacco: Never Used  . Alcohol use 0.0 oz/week     Comment: Rarely  . Drug use: No  . Sexual activity: Not on file

## 2017-08-22 DIAGNOSIS — M75122 Complete rotator cuff tear or rupture of left shoulder, not specified as traumatic: Secondary | ICD-10-CM | POA: Diagnosis not present

## 2017-08-25 DIAGNOSIS — M75122 Complete rotator cuff tear or rupture of left shoulder, not specified as traumatic: Secondary | ICD-10-CM | POA: Diagnosis not present

## 2017-09-05 DIAGNOSIS — M75122 Complete rotator cuff tear or rupture of left shoulder, not specified as traumatic: Secondary | ICD-10-CM | POA: Diagnosis not present

## 2017-09-08 DIAGNOSIS — M75122 Complete rotator cuff tear or rupture of left shoulder, not specified as traumatic: Secondary | ICD-10-CM | POA: Diagnosis not present

## 2017-09-14 ENCOUNTER — Encounter (INDEPENDENT_AMBULATORY_CARE_PROVIDER_SITE_OTHER): Payer: Self-pay | Admitting: Orthopedic Surgery

## 2017-09-14 ENCOUNTER — Ambulatory Visit (INDEPENDENT_AMBULATORY_CARE_PROVIDER_SITE_OTHER): Payer: Medicare HMO | Admitting: Orthopedic Surgery

## 2017-09-14 DIAGNOSIS — M75122 Complete rotator cuff tear or rupture of left shoulder, not specified as traumatic: Secondary | ICD-10-CM

## 2017-09-15 DIAGNOSIS — M75122 Complete rotator cuff tear or rupture of left shoulder, not specified as traumatic: Secondary | ICD-10-CM | POA: Diagnosis not present

## 2017-09-18 NOTE — Progress Notes (Signed)
   Post-Op Visit Note   Patient: Jeffrey Wong           Date of Birth: July 26, 1946           MRN: [id number] Visit Date: 09/14/2017 PCP: Shirline Frees, MD   Assessment & Plan:  Chief Complaint:  Chief Complaint  Patient presents with  . Left Shoulder - Follow-up   Visit Diagnoses:  1. Complete tear of left rotator cuff   [name] is a patient is 10 weeks out left shoulder rotator cuff repair still is having some muscle spasm and still goes physical therapy.  On examination he has a little bit more laxity in his biceps than the last clinic visit.  Rotator cuff repair feels very good with no coarseness with passive range of motion.  He is a little bit on the stiff side in terms of external rotation and forward flexion but that is improving.  He needs to be careful for the next month in terms of strengthening.  I'll see him back in 2 months for clinical recheck.  Plan: See above  Follow-Up Instructions: Return in about 8 weeks (around 11/09/2017).   Orders:  No orders of the defined types were placed in this encounter.  No orders of the defined types were placed in this encounter.   Imaging: No results found.  PMFS History: Patient Active Problem List   Diagnosis Date Noted  . Complete tear of left rotator cuff 07/18/2017  . Upper airway cough syndrome 02/10/2016  . Vertigo 11/21/2015   Past Medical History:  Diagnosis Date  . Arthritis   . Diverticulitis   . DJD (degenerative joint disease)   . Esophageal reflux   . Essential hypertension, benign   . Other and unspecified hyperlipidemia   . Vertigo 11/21/2015    Family History  Problem Relation Age of Onset  . Hyperlipidemia Mother   . Hypertension Mother   . Heart disease Mother   . Hyperlipidemia Father   . Other Father        tumor  . Brain cancer Father   . Hyperlipidemia Sister   . Hypertension Sister     Past Surgical History:  Procedure Laterality Date  . APPENDECTOMY     Social History    Occupational History  . Retired Other   Social History Main Topics  . Smoking status: Former Smoker    Packs/day: 1.00    Years: 15.00    Types: Cigarettes    Quit date: 12/07/1983  . Smokeless tobacco: Never Used  . Alcohol use 0.0 oz/week     Comment: Rarely  . Drug use: No  . Sexual activity: Not on file

## 2017-09-19 DIAGNOSIS — M75122 Complete rotator cuff tear or rupture of left shoulder, not specified as traumatic: Secondary | ICD-10-CM | POA: Diagnosis not present

## 2017-09-29 DIAGNOSIS — M75122 Complete rotator cuff tear or rupture of left shoulder, not specified as traumatic: Secondary | ICD-10-CM | POA: Diagnosis not present

## 2017-10-06 DIAGNOSIS — W19XXXA Unspecified fall, initial encounter: Secondary | ICD-10-CM | POA: Diagnosis not present

## 2017-10-06 DIAGNOSIS — M75122 Complete rotator cuff tear or rupture of left shoulder, not specified as traumatic: Secondary | ICD-10-CM | POA: Diagnosis not present

## 2017-10-06 DIAGNOSIS — S76011A Strain of muscle, fascia and tendon of right hip, initial encounter: Secondary | ICD-10-CM | POA: Diagnosis not present

## 2017-11-14 ENCOUNTER — Ambulatory Visit (INDEPENDENT_AMBULATORY_CARE_PROVIDER_SITE_OTHER): Payer: Medicare HMO | Admitting: Orthopedic Surgery

## 2017-11-21 ENCOUNTER — Ambulatory Visit (INDEPENDENT_AMBULATORY_CARE_PROVIDER_SITE_OTHER): Payer: Medicare HMO | Admitting: Orthopedic Surgery

## 2017-11-21 ENCOUNTER — Encounter (INDEPENDENT_AMBULATORY_CARE_PROVIDER_SITE_OTHER): Payer: Self-pay | Admitting: Orthopedic Surgery

## 2017-11-21 DIAGNOSIS — M75122 Complete rotator cuff tear or rupture of left shoulder, not specified as traumatic: Secondary | ICD-10-CM

## 2017-11-23 NOTE — Progress Notes (Signed)
   Post-Op Visit Note   Patient: Jeffrey Wong           Date of Birth: May 17, 1946           MRN: [id number] Visit Date: 11/21/2017 PCP: Shirline Frees, MD   Assessment & Plan:  Chief Complaint:  Chief Complaint  Patient presents with  . Left Shoulder - Follow-up   Visit Diagnoses: No diagnosis found.  Plan: Jeffrey Wong is a patient who is now over 3 months from left shoulder rotator cuff repair.  A fall and fell on his shoulder.  He states that his right hip popped at that time as well.  Radiographs were negative on November 1 of this year.  No ecchymosis associated with that injury.  Examination of the shoulder demonstrates good range of motion and strength with no coarse grinding or crepitus.  Right hip exam is normal but he does have some trochanteric tenderness.  Plan is to return in 6 weeks and we will scan the hip.  This may represent a gluteal tear.  I think the shoulder looks good but I do not want him doing any weight lifting until I see him back.  Follow-Up Instructions: Return in about 6 weeks (around 01/02/2018).   Orders:  No orders of the defined types were placed in this encounter.  No orders of the defined types were placed in this encounter.   Imaging: No results found.  PMFS History: Patient Active Problem List   Diagnosis Date Noted  . Complete tear of left rotator cuff 07/18/2017  . Upper airway cough syndrome 02/10/2016  . Vertigo 11/21/2015   Past Medical History:  Diagnosis Date  . Arthritis   . Diverticulitis   . DJD (degenerative joint disease)   . Esophageal reflux   . Essential hypertension, benign   . Other and unspecified hyperlipidemia   . Vertigo 11/21/2015    Family History  Problem Relation Age of Onset  . Hyperlipidemia Mother   . Hypertension Mother   . Heart disease Mother   . Hyperlipidemia Father   . Other Father        tumor  . Brain cancer Father   . Hyperlipidemia Sister   . Hypertension Sister     Past Surgical History:   Procedure Laterality Date  . APPENDECTOMY     Social History   Occupational History  . Occupation: Retired    Fish farm manager: [employer]  Tobacco Use  . Smoking status: Former Smoker    Packs/day: 1.00    Years: 15.00    Pack years: 15.00    Types: Cigarettes    Last attempt to quit: 12/07/1983    Years since quitting: 33.9  . Smokeless tobacco: Never Used  Substance and Sexual Activity  . Alcohol use: Yes    Alcohol/week: 0.0 oz    Comment: Rarely  . Drug use: No  . Sexual activity: Not on file

## 2017-12-08 DIAGNOSIS — H0230 Blepharochalasis unspecified eye, unspecified eyelid: Secondary | ICD-10-CM | POA: Diagnosis not present

## 2018-01-05 ENCOUNTER — Ambulatory Visit (INDEPENDENT_AMBULATORY_CARE_PROVIDER_SITE_OTHER): Payer: Medicare HMO

## 2018-01-05 ENCOUNTER — Ambulatory Visit (INDEPENDENT_AMBULATORY_CARE_PROVIDER_SITE_OTHER): Payer: Medicare HMO | Admitting: Orthopedic Surgery

## 2018-01-05 ENCOUNTER — Encounter (INDEPENDENT_AMBULATORY_CARE_PROVIDER_SITE_OTHER): Payer: Self-pay | Admitting: Orthopedic Surgery

## 2018-01-05 DIAGNOSIS — M25551 Pain in right hip: Secondary | ICD-10-CM | POA: Diagnosis not present

## 2018-01-05 DIAGNOSIS — G8929 Other chronic pain: Secondary | ICD-10-CM | POA: Diagnosis not present

## 2018-01-05 DIAGNOSIS — M545 Low back pain: Secondary | ICD-10-CM | POA: Diagnosis not present

## 2018-01-05 NOTE — Progress Notes (Signed)
Office Visit Note   Patient: Jeffrey Wong           Date of Birth: 25-Dec-1945           MRN: [id number] Visit Date: 01/05/2018 Requested by: Shirline Frees, MD [place], [place] [address] PCP: Shirline Frees, MD  Subjective: Chief Complaint  Patient presents with  . Left Shoulder - Follow-up    HPI: Jeffrey Wong is a patient who is now 6 months out left shoulder rotator cuff repair.  In general he is doing well and he is back to weight lifting but in a very measured manner.  He denies any problems with the shoulder.  He is having some problems with the right hip.  Had a fairly severe abduction injury in October.  Plain radiographs at that time were normal.  He reports fairly significant low back pain radiating around that hip and into the leg.  He does have a history of L4-5 disc surgery in the 1980s.  The pain is somewhat incapacitating for him and has not responded to nonoperative measures such as stretching as well as medication.              ROS: All systems reviewed are negative as they relate to the chief complaint within the history of present illness.  Patient denies  fevers or chills. Impression is  Assessment & Plan: Visit Diagnoses:  1. Chronic low back pain, unspecified back pain laterality, with sciatica presence unspecified   2. Pain in right hip     Plan: Right hip pain which may be early arthritis versus a labral tear based on mechanism of injury.  Patient also has significant arthritis at L4-5 from prior surgery.  This could also be giving him referred pain on that right-hand side.  Symptoms have been ongoing now for 3 months.  I would like to refer him to Dr. Ernestina Patches for right hip injection for diagnostic and therapeutic purposes.  Needs MRI lumbar spine as a preamble to possible epidural steroid injection around the L4-5 region.  Also needs MRI of the pelvis to evaluate for possible labral injury in that right hip.  No real focal muscle weakness  in the right hip region.  Follow-Up Instructions: Return for after MRI.   Orders:  Orders Placed This Encounter  Procedures  . XR Lumbar Spine 2-3 Views  . XR HIP UNILAT W OR W/O PELVIS 2-3 VIEWS RIGHT  . MR Lumbar Spine w/o contrast  . MR Pelvis w/o contrast  . Ambulatory referral to Physical Medicine Rehab   No orders of the defined types were placed in this encounter.     Procedures: No procedures performed   Clinical Data: No additional findings.  Objective: Vital Signs: There were no vitals taken for this visit.  Physical Exam:   Constitutional: Patient appears well-developed HEENT:  Head: Normocephalic Eyes:EOM are normal Neck: Normal range of motion Cardiovascular: Normal rate Pulmonary/chest: Effort normal Neurologic: Patient is alert Skin: Skin is warm Psychiatric: Patient has normal mood and affect    Ortho Exam: Orthopedic exam demonstrates excellent range of motion of the left shoulder with smooth transition from abduction to extension and forward flexion.  Patient has good rotator cuff strength is well.  He has no nerve root tension signs and some pain with forward and lateral bending.  Has SI joint tenderness on the right.  Mild groin pain with internal/external rotation on the right compared to the left.  Has good hip  abduction and adduction and flexion strength.  No real groin symptoms on the left with rotation.  No focal trochanteric tenderness is noted.  No definite paresthesias L1-S1 bilaterally.  Reflexes symmetric.  Specialty Comments:  No specialty comments available.  Imaging: Xr Hip Unilat W Or W/o Pelvis 2-3 Views Right  Result Date: 01/05/2018 AP pelvis lateral right hip reviewed.  Mild joint space narrowing is noted in the hip joint.  Remainder of the bony pelvis is normal.  No fracture or dislocation present.  Xr Lumbar Spine 2-3 Views  Result Date: 01/05/2018 AP lateral lumbar spine reviewed.  Patient has L4-5 disc space narrowing and  facet arthritis at that level.  No spondylolisthesis or compression fractures present.    PMFS History: Patient Active Problem List   Diagnosis Date Noted  . Complete tear of left rotator cuff 07/18/2017  . Upper airway cough syndrome 02/10/2016  . Vertigo 11/21/2015   Past Medical History:  Diagnosis Date  . Arthritis   . Diverticulitis   . DJD (degenerative joint disease)   . Esophageal reflux   . Essential hypertension, benign   . Other and unspecified hyperlipidemia   . Vertigo 11/21/2015    Family History  Problem Relation Age of Onset  . Hyperlipidemia Mother   . Hypertension Mother   . Heart disease Mother   . Hyperlipidemia Father   . Other Father        tumor  . Brain cancer Father   . Hyperlipidemia Sister   . Hypertension Sister     Past Surgical History:  Procedure Laterality Date  . APPENDECTOMY     Social History   Occupational History  . Occupation: Retired    Fish farm manager: [employer]  Tobacco Use  . Smoking status: Former Smoker    Packs/day: 1.00    Years: 15.00    Pack years: 15.00    Types: Cigarettes    Last attempt to quit: 12/07/1983    Years since quitting: 34.1  . Smokeless tobacco: Never Used  Substance and Sexual Activity  . Alcohol use: Yes    Alcohol/week: 0.0 oz    Comment: Rarely  . Drug use: No  . Sexual activity: Not on file

## 2018-01-19 ENCOUNTER — Ambulatory Visit (INDEPENDENT_AMBULATORY_CARE_PROVIDER_SITE_OTHER): Payer: Self-pay | Admitting: Physical Medicine and Rehabilitation

## 2018-01-24 ENCOUNTER — Ambulatory Visit
Admission: RE | Admit: 2018-01-24 | Discharge: 2018-01-24 | Disposition: A | Discharge status: Home / Self Care | Payer: Medicare HMO | Source: Ambulatory Visit | Attending: Orthopedic Surgery | Admitting: Orthopedic Surgery

## 2018-01-24 DIAGNOSIS — M545 Low back pain: Principal | ICD-10-CM

## 2018-01-24 DIAGNOSIS — M25551 Pain in right hip: Secondary | ICD-10-CM

## 2018-01-24 DIAGNOSIS — N400 Enlarged prostate without lower urinary tract symptoms: Secondary | ICD-10-CM | POA: Diagnosis not present

## 2018-01-24 DIAGNOSIS — M48061 Spinal stenosis, lumbar region without neurogenic claudication: Secondary | ICD-10-CM | POA: Diagnosis not present

## 2018-01-24 DIAGNOSIS — G8929 Other chronic pain: Secondary | ICD-10-CM

## 2018-01-27 ENCOUNTER — Encounter (INDEPENDENT_AMBULATORY_CARE_PROVIDER_SITE_OTHER): Payer: Self-pay | Admitting: Orthopedic Surgery

## 2018-01-27 ENCOUNTER — Ambulatory Visit (INDEPENDENT_AMBULATORY_CARE_PROVIDER_SITE_OTHER): Payer: Medicare HMO | Admitting: Orthopedic Surgery

## 2018-01-27 DIAGNOSIS — M25512 Pain in left shoulder: Secondary | ICD-10-CM

## 2018-01-29 ENCOUNTER — Encounter (INDEPENDENT_AMBULATORY_CARE_PROVIDER_SITE_OTHER): Payer: Self-pay | Admitting: Orthopedic Surgery

## 2018-01-29 NOTE — Progress Notes (Signed)
Office Visit Note   Patient: Jeffrey Wong           Date of Birth: November 12, 1946           MRN: [id number] Visit Date: 01/27/2018 Requested by: Shirline Frees, MD [place], [place] [address] PCP: Shirline Frees, MD  Subjective: Chief Complaint  Patient presents with  . Lower Back - Follow-up    HPI: Jeffrey Wong is a 72 year old patien with right hip pain here to review pelvis and lumbar spine MRI.  He also reports mild left AC joint pain which began 3 days ago.  Chiropractor has helped him with his symptoms.  Pelvic MRI scan demonstrates intact musculature intact joint and mild right trochanteric bursitis.  Spine MRI demonstrates L5-S1 degenerative disc disease consistent with his prior surgery.  His shoulder rotator cuff repair is doing well.            ROS: All systems reviewed are negative as they relate to the chief complaint within the history of present illness.  Patient denies  fevers or chills.   Assessment & Plan: Visit Diagnoses:  1. Acute pain of left shoulder     Plan: Pression is no discrete right hip arthritis or muscle tear.  He does have some back degenerative disc disease.  Step would be injection into each.  He wants to hold off on that intervention for the trochanteric bursa and the back.  I will see him back as needed.  Follow-Up Instructions: Return if symptoms worsen or fail to improve.   Orders:  No orders of the defined types were placed in this encounter.  No orders of the defined types were placed in this encounter.     Procedures: No procedures performed   Clinical Data: No additional findings.  Objective: Vital Signs: There were no vitals taken for this visit.  Physical Exam:   Constitutional: Patient appears well-developed HEENT:  Head: Normocephalic Eyes:EOM are normal Neck: Normal range of motion Cardiovascular: Normal rate Pulmonary/chest: Effort normal Neurologic: Patient is alert Skin: Skin is  warm Psychiatric: Patient has normal mood and affect    Ortho Exam: Pubic exam demonstrates no groin pain with internal/external rotation of either leg.  Good ankle dorsiflexion plantar flexion quad and hamstring strength.  No paresthesias.  Mild pain with forward and lateral bending.  Trochanteric tenderness.  Is mild.  Specialty Comments:  No specialty comments available.  Imaging: No results found.   PMFS History: Patient Active Problem List   Diagnosis Date Noted  . Complete tear of left rotator cuff 07/18/2017  . Upper airway cough syndrome 02/10/2016  . Vertigo 11/21/2015   Past Medical History:  Diagnosis Date  . Arthritis   . Diverticulitis   . DJD (degenerative joint disease)   . Esophageal reflux   . Essential hypertension, benign   . Other and unspecified hyperlipidemia   . Vertigo 11/21/2015    Family History  Problem Relation Age of Onset  . Hyperlipidemia Mother   . Hypertension Mother   . Heart disease Mother   . Hyperlipidemia Father   . Other Father        tumor  . Brain cancer Father   . Hyperlipidemia Sister   . Hypertension Sister     Past Surgical History:  Procedure Laterality Date  . APPENDECTOMY     Social History   Occupational History  . Occupation: Retired    Fish farm manager: [employer]  Tobacco Use  . Smoking status: Former Smoker  Packs/day: 1.00    Years: 15.00    Pack years: 15.00    Types: Cigarettes    Last attempt to quit: 12/07/1983    Years since quitting: 34.1  . Smokeless tobacco: Never Used  Substance and Sexual Activity  . Alcohol use: Yes    Alcohol/week: 0.0 oz    Comment: Rarely  . Drug use: No  . Sexual activity: Not on file

## 2018-02-01 DIAGNOSIS — N183 Chronic kidney disease, stage 3 (moderate): Secondary | ICD-10-CM | POA: Diagnosis not present

## 2018-02-01 DIAGNOSIS — J018 Other acute sinusitis: Secondary | ICD-10-CM | POA: Diagnosis not present

## 2018-02-01 DIAGNOSIS — B009 Herpesviral infection, unspecified: Secondary | ICD-10-CM | POA: Diagnosis not present

## 2018-02-01 DIAGNOSIS — I10 Essential (primary) hypertension: Secondary | ICD-10-CM | POA: Diagnosis not present

## 2018-02-01 DIAGNOSIS — K219 Gastro-esophageal reflux disease without esophagitis: Secondary | ICD-10-CM | POA: Diagnosis not present

## 2018-02-01 DIAGNOSIS — M1711 Unilateral primary osteoarthritis, right knee: Secondary | ICD-10-CM | POA: Diagnosis not present

## 2018-02-01 DIAGNOSIS — R7303 Prediabetes: Secondary | ICD-10-CM | POA: Diagnosis not present

## 2018-02-01 DIAGNOSIS — E782 Mixed hyperlipidemia: Secondary | ICD-10-CM | POA: Diagnosis not present

## 2018-02-03 ENCOUNTER — Other Ambulatory Visit: Payer: Self-pay | Admitting: Physician Assistant

## 2018-02-03 DIAGNOSIS — D485 Neoplasm of uncertain behavior of skin: Secondary | ICD-10-CM | POA: Diagnosis not present

## 2018-02-03 DIAGNOSIS — D2122 Benign neoplasm of connective and other soft tissue of left lower limb, including hip: Secondary | ICD-10-CM | POA: Diagnosis not present

## 2018-02-03 DIAGNOSIS — D229 Melanocytic nevi, unspecified: Secondary | ICD-10-CM | POA: Diagnosis not present

## 2018-02-03 DIAGNOSIS — L570 Actinic keratosis: Secondary | ICD-10-CM | POA: Diagnosis not present

## 2018-02-03 DIAGNOSIS — L821 Other seborrheic keratosis: Secondary | ICD-10-CM | POA: Diagnosis not present

## 2018-02-21 DIAGNOSIS — H2513 Age-related nuclear cataract, bilateral: Secondary | ICD-10-CM | POA: Diagnosis not present

## 2018-02-21 DIAGNOSIS — H524 Presbyopia: Secondary | ICD-10-CM | POA: Diagnosis not present

## 2018-02-21 DIAGNOSIS — H04123 Dry eye syndrome of bilateral lacrimal glands: Secondary | ICD-10-CM | POA: Diagnosis not present

## 2018-04-23 DIAGNOSIS — Z711 Person with feared health complaint in whom no diagnosis is made: Secondary | ICD-10-CM | POA: Diagnosis not present

## 2018-04-23 DIAGNOSIS — R220 Localized swelling, mass and lump, head: Secondary | ICD-10-CM | POA: Diagnosis not present

## 2018-06-19 DIAGNOSIS — I10 Essential (primary) hypertension: Secondary | ICD-10-CM | POA: Diagnosis not present

## 2018-06-19 DIAGNOSIS — M79675 Pain in left toe(s): Secondary | ICD-10-CM | POA: Diagnosis not present

## 2018-06-19 DIAGNOSIS — R1032 Left lower quadrant pain: Secondary | ICD-10-CM | POA: Diagnosis not present

## 2018-07-19 DIAGNOSIS — L570 Actinic keratosis: Secondary | ICD-10-CM | POA: Diagnosis not present

## 2018-07-19 DIAGNOSIS — D229 Melanocytic nevi, unspecified: Secondary | ICD-10-CM | POA: Diagnosis not present

## 2018-07-19 DIAGNOSIS — L820 Inflamed seborrheic keratosis: Secondary | ICD-10-CM | POA: Diagnosis not present

## 2018-07-27 DIAGNOSIS — R350 Frequency of micturition: Secondary | ICD-10-CM | POA: Diagnosis not present

## 2018-08-23 DIAGNOSIS — Z125 Encounter for screening for malignant neoplasm of prostate: Secondary | ICD-10-CM | POA: Diagnosis not present

## 2018-08-23 DIAGNOSIS — D649 Anemia, unspecified: Secondary | ICD-10-CM | POA: Diagnosis not present

## 2018-08-23 DIAGNOSIS — I10 Essential (primary) hypertension: Secondary | ICD-10-CM | POA: Diagnosis not present

## 2018-08-23 DIAGNOSIS — K219 Gastro-esophageal reflux disease without esophagitis: Secondary | ICD-10-CM | POA: Diagnosis not present

## 2018-08-23 DIAGNOSIS — N183 Chronic kidney disease, stage 3 (moderate): Secondary | ICD-10-CM | POA: Diagnosis not present

## 2018-08-23 DIAGNOSIS — R7303 Prediabetes: Secondary | ICD-10-CM | POA: Diagnosis not present

## 2018-08-23 DIAGNOSIS — Z1159 Encounter for screening for other viral diseases: Secondary | ICD-10-CM | POA: Diagnosis not present

## 2018-08-23 DIAGNOSIS — Z0000 Encounter for general adult medical examination without abnormal findings: Secondary | ICD-10-CM | POA: Diagnosis not present

## 2018-08-23 DIAGNOSIS — E7800 Pure hypercholesterolemia, unspecified: Secondary | ICD-10-CM | POA: Diagnosis not present

## 2018-12-02 DIAGNOSIS — M109 Gout, unspecified: Secondary | ICD-10-CM | POA: Diagnosis not present

## 2019-01-18 DIAGNOSIS — L821 Other seborrheic keratosis: Secondary | ICD-10-CM | POA: Diagnosis not present

## 2019-01-18 DIAGNOSIS — B079 Viral wart, unspecified: Secondary | ICD-10-CM | POA: Diagnosis not present

## 2019-01-18 DIAGNOSIS — L570 Actinic keratosis: Secondary | ICD-10-CM | POA: Diagnosis not present

## 2019-06-18 DIAGNOSIS — M7061 Trochanteric bursitis, right hip: Secondary | ICD-10-CM | POA: Diagnosis not present

## 2019-06-18 DIAGNOSIS — M25551 Pain in right hip: Secondary | ICD-10-CM | POA: Diagnosis not present

## 2019-07-12 DIAGNOSIS — L728 Other follicular cysts of the skin and subcutaneous tissue: Secondary | ICD-10-CM | POA: Diagnosis not present

## 2019-07-12 DIAGNOSIS — L570 Actinic keratosis: Secondary | ICD-10-CM | POA: Diagnosis not present

## 2019-07-12 DIAGNOSIS — L820 Inflamed seborrheic keratosis: Secondary | ICD-10-CM | POA: Diagnosis not present

## 2019-07-19 DIAGNOSIS — R350 Frequency of micturition: Secondary | ICD-10-CM | POA: Diagnosis not present

## 2019-07-19 DIAGNOSIS — F439 Reaction to severe stress, unspecified: Secondary | ICD-10-CM | POA: Diagnosis not present

## 2019-07-19 DIAGNOSIS — R7303 Prediabetes: Secondary | ICD-10-CM | POA: Diagnosis not present

## 2019-07-19 DIAGNOSIS — R3912 Poor urinary stream: Secondary | ICD-10-CM | POA: Diagnosis not present

## 2019-07-19 DIAGNOSIS — N401 Enlarged prostate with lower urinary tract symptoms: Secondary | ICD-10-CM | POA: Diagnosis not present

## 2019-07-24 DIAGNOSIS — M109 Gout, unspecified: Secondary | ICD-10-CM | POA: Diagnosis not present

## 2019-08-31 DIAGNOSIS — Z23 Encounter for immunization: Secondary | ICD-10-CM | POA: Diagnosis not present

## 2019-08-31 DIAGNOSIS — R7303 Prediabetes: Secondary | ICD-10-CM | POA: Diagnosis not present

## 2019-08-31 DIAGNOSIS — E782 Mixed hyperlipidemia: Secondary | ICD-10-CM | POA: Diagnosis not present

## 2019-08-31 DIAGNOSIS — Z0000 Encounter for general adult medical examination without abnormal findings: Secondary | ICD-10-CM | POA: Diagnosis not present

## 2019-08-31 DIAGNOSIS — N401 Enlarged prostate with lower urinary tract symptoms: Secondary | ICD-10-CM | POA: Diagnosis not present

## 2019-08-31 DIAGNOSIS — M1A0720 Idiopathic chronic gout, left ankle and foot, without tophus (tophi): Secondary | ICD-10-CM | POA: Diagnosis not present

## 2019-08-31 DIAGNOSIS — I10 Essential (primary) hypertension: Secondary | ICD-10-CM | POA: Diagnosis not present

## 2019-08-31 DIAGNOSIS — N183 Chronic kidney disease, stage 3 (moderate): Secondary | ICD-10-CM | POA: Diagnosis not present

## 2019-09-18 DIAGNOSIS — I10 Essential (primary) hypertension: Secondary | ICD-10-CM | POA: Diagnosis not present

## 2019-09-26 DIAGNOSIS — M7061 Trochanteric bursitis, right hip: Secondary | ICD-10-CM | POA: Diagnosis not present

## 2019-09-26 DIAGNOSIS — M25551 Pain in right hip: Secondary | ICD-10-CM | POA: Diagnosis not present

## 2019-11-21 DIAGNOSIS — M7702 Medial epicondylitis, left elbow: Secondary | ICD-10-CM | POA: Diagnosis not present

## 2020-01-07 ENCOUNTER — Ambulatory Visit: Payer: Medicare HMO | Admitting: Orthopedic Surgery

## 2020-01-07 ENCOUNTER — Ambulatory Visit (INDEPENDENT_AMBULATORY_CARE_PROVIDER_SITE_OTHER): Payer: Medicare HMO

## 2020-01-07 ENCOUNTER — Encounter: Payer: Self-pay | Admitting: Orthopedic Surgery

## 2020-01-07 ENCOUNTER — Other Ambulatory Visit: Payer: Self-pay

## 2020-01-07 DIAGNOSIS — M25522 Pain in left elbow: Secondary | ICD-10-CM | POA: Diagnosis not present

## 2020-01-11 DIAGNOSIS — L820 Inflamed seborrheic keratosis: Secondary | ICD-10-CM | POA: Diagnosis not present

## 2020-01-11 DIAGNOSIS — L570 Actinic keratosis: Secondary | ICD-10-CM | POA: Diagnosis not present

## 2020-01-11 DIAGNOSIS — D229 Melanocytic nevi, unspecified: Secondary | ICD-10-CM | POA: Diagnosis not present

## 2020-01-13 ENCOUNTER — Encounter: Payer: Self-pay | Admitting: Orthopedic Surgery

## 2020-01-13 NOTE — Progress Notes (Signed)
Office Visit Note   Patient: Jeffrey Wong           Date of Birth: [birth year]           MRN: [id number] Visit Date: 01/07/2020 Requested by: Shirline Frees, MD [place],  [place] [address] PCP: Shirline Frees, MD  Subjective: Chief Complaint  Patient presents with  . Left Elbow - Pain    HPI: Jeffrey Wong is a patient with left elbow pain of 2 months duration.  Tender to touch on the medial epicondyle region.  Denies any history of injury.  He has been treated with a prednisone injection in his hip for the problem which did not give him much relief.  He is right-hand dominant.  Does like to lift weights.  Been using Aleve and Biofreeze.              ROS: All systems reviewed are negative as they relate to the chief complaint within the history of present illness.  Patient denies  fevers or chills.   Assessment & Plan: Visit Diagnoses:  1. Pain in left elbow     Plan: Impression is medial common flexor origin tendinosis.  This is a difficult problem for fill.  Discussed treatment options which are really fairly limited for the side of the elbow.  If he gets to the point where to surgical problem I would recommend MRI scanning to evaluate the amount of tenderness is present.  Ultrasound examination demonstrated mild tendinosis but no overt tearing of that common flexor origin.  Therapy and cardio exercises indicated.  Follow-up with me as needed.  Follow-Up Instructions: Return if symptoms worsen or fail to improve.   Orders:  Orders Placed This Encounter  Procedures  . XR Elbow 2 Views Left   No orders of the defined types were placed in this encounter.     Procedures: No procedures performed   Clinical Data: No additional findings.  Objective: Vital Signs: There were no vitals taken for this visit.  Physical Exam:   Constitutional: Patient appears well-developed HEENT:  Head: Normocephalic Eyes:EOM are normal Neck: Normal range of  motion Cardiovascular: Normal rate Pulmonary/chest: Effort normal Neurologic: Patient is alert Skin: Skin is warm Psychiatric: Patient has normal mood and affect    Ortho Exam: Ortho exam demonstrates full active and passive range of motion of the left elbow.  A little bit of pain with forced pronation and wrist flexion.  No pain with resisted supination and wrist extension.  Mild tenderness over the medial epicondyle but no subluxation of the ulnar nerve.  No masses lymphadenopathy or skin changes noted in that elbow region.  Motor sensory function hand is intact.  Specialty Comments:  No specialty comments available.  Imaging: No results found.   PMFS History: Patient Active Problem List   Diagnosis Date Noted  . Complete tear of left rotator cuff 07/18/2017  . Upper airway cough syndrome 02/10/2016  . Vertigo 11/21/2015   Past Medical History:  Diagnosis Date  . Arthritis   . 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 Diverticulitis   . DJD (degenerative joint disease)   . Esophageal reflux   . Essential hypertension, benign   . Other and unspecified hyperlipidemia   . Vertigo 11/21/2015    Family History  Problem Relation Age of Onset  . Hyperlipidemia Mother   . Hypertension Mother   . Heart disease Mother   . Hyperlipidemia Father   .  Other Father        tumor  . Brain cancer Father   . Hyperlipidemia Sister   . Hypertension Sister     Past Surgical History:  Procedure Laterality Date  . APPENDECTOMY     Social History   Occupational History  . Occupation: Retired    Fish farm manager: [employer]  Tobacco Use  . Smoking status: Former Smoker    Packs/day: 1.00    Years: 15.00    Pack years: 15.00    Types: Cigarettes    Quit date: 12/07/1983    Years since quitting: 36.1  . Smokeless tobacco: Never Used  Substance and Sexual Activity  . Alcohol use: Yes    Alcohol/week: 0.0 standard drinks    Comment: Rarely  . Drug use: No  .  Sexual activity: Not on file

## 2020-02-29 DIAGNOSIS — E782 Mixed hyperlipidemia: Secondary | ICD-10-CM | POA: Diagnosis not present

## 2020-02-29 DIAGNOSIS — K219 Gastro-esophageal reflux disease without esophagitis: Secondary | ICD-10-CM | POA: Diagnosis not present

## 2020-02-29 DIAGNOSIS — J309 Allergic rhinitis, unspecified: Secondary | ICD-10-CM | POA: Diagnosis not present

## 2020-02-29 DIAGNOSIS — N183 Chronic kidney disease, stage 3 unspecified: Secondary | ICD-10-CM | POA: Diagnosis not present

## 2020-02-29 DIAGNOSIS — M109 Gout, unspecified: Secondary | ICD-10-CM | POA: Diagnosis not present

## 2020-02-29 DIAGNOSIS — R0602 Shortness of breath: Secondary | ICD-10-CM | POA: Diagnosis not present

## 2020-02-29 DIAGNOSIS — R7303 Prediabetes: Secondary | ICD-10-CM | POA: Diagnosis not present

## 2020-02-29 DIAGNOSIS — I10 Essential (primary) hypertension: Secondary | ICD-10-CM | POA: Diagnosis not present

## 2020-07-08 ENCOUNTER — Ambulatory Visit: Payer: Medicare HMO | Admitting: Physician Assistant

## 2020-07-09 ENCOUNTER — Ambulatory Visit: Payer: Medicare HMO | Admitting: Physician Assistant

## 2020-08-05 DIAGNOSIS — H5201 Hypermetropia, right eye: Secondary | ICD-10-CM | POA: Diagnosis not present

## 2020-08-05 DIAGNOSIS — H04123 Dry eye syndrome of bilateral lacrimal glands: Secondary | ICD-10-CM | POA: Diagnosis not present

## 2020-08-05 DIAGNOSIS — H52203 Unspecified astigmatism, bilateral: Secondary | ICD-10-CM | POA: Diagnosis not present

## 2020-08-21 ENCOUNTER — Other Ambulatory Visit: Payer: Self-pay

## 2020-08-21 ENCOUNTER — Encounter: Payer: Self-pay | Admitting: Physician Assistant

## 2020-08-21 ENCOUNTER — Ambulatory Visit: Payer: Medicare HMO | Admitting: Physician Assistant

## 2020-08-21 DIAGNOSIS — Z86018 Personal history of other benign neoplasm: Secondary | ICD-10-CM | POA: Diagnosis not present

## 2020-08-21 DIAGNOSIS — L570 Actinic keratosis: Secondary | ICD-10-CM

## 2020-08-21 DIAGNOSIS — D0462 Carcinoma in situ of skin of left upper limb, including shoulder: Secondary | ICD-10-CM

## 2020-08-21 DIAGNOSIS — D0472 Carcinoma in situ of skin of left lower limb, including hip: Secondary | ICD-10-CM | POA: Diagnosis not present

## 2020-08-21 DIAGNOSIS — Z1283 Encounter for screening for malignant neoplasm of skin: Secondary | ICD-10-CM | POA: Diagnosis not present

## 2020-08-21 NOTE — Patient Instructions (Signed)

## 2020-08-27 NOTE — Progress Notes (Signed)
Follow-Up Visit   Subjective  Jeffrey Wong is a 74 y.o. male who presents for the following: Follow-up (6 month follow up--skin check).   The following portions of the chart were reviewed this encounter and updated as appropriate: Tobacco  Allergies  Meds  Problems  Med Hx  Surg Hx  Fam Hx      Objective  Well appearing patient in no apparent distress; mood and affect are within normal limits.  A focused examination was performed including waist up and legs. Relevant physical exam findings are noted in the Assessment and Plan.  Objective  Right Upper Back: Scars clear  Objective  waist up and legs: No atypical nevi No signs of non-mole skin cancer.   Objective  Left Forearm - Posterior, Left Lower Leg - Anterior, Left Lower Leg - Posterior, Left Thigh - Anterior, Left Thigh - Posterior, Left Upper Arm - Posterior (3), Left Upper Back (8), Neck - Anterior (5), Neck - Posterior, Right Breast, Right Forearm - Posterior, Right Hand - Posterior, Right Lower Leg - Anterior, Right Lower Leg - Posterior, Right Upper Arm - Posterior, Right Upper Back: Erythematous patches with gritty scale.  Objective  Left Antecubital Fossa: Hyperkeratotic scale with pink base       Assessment & Plan  History of dysplastic nevus Right Upper Back  observe  Screening exam for skin cancer waist up and legs  Biannual skin exam  AK (actinic keratosis) (29) Right Hand - Posterior; Neck - Anterior (5); Neck - Posterior; Left Upper Arm - Posterior (3); Right Upper Arm - Posterior; Left Forearm - Posterior; Right Forearm - Posterior; Left Thigh - Anterior; Left Thigh - Posterior; Left Lower Leg - Anterior; Right Lower Leg - Anterior; Left Lower Leg - Posterior; Right Lower Leg - Posterior; Right Breast; Left Upper Back (8); Right Upper Back  Destruction of lesion - Left Forearm - Posterior, Left Lower Leg - Anterior, Left Lower Leg - Posterior, Left Thigh - Anterior, Left Thigh -  Posterior, Left Upper Arm - Posterior, Left Upper Back, Neck - Anterior, Neck - Posterior, Right Breast, Right Forearm - Posterior, Right Hand - Posterior, Right Lower Leg - Anterior, Right Lower Leg - Posterior, Right Upper Arm - Posterior, Right Upper Back Complexity: simple   Destruction method: cryotherapy   Informed consent: discussed and consent obtained   Timeout:  patient name, date of birth, surgical site, and procedure verified Lesion destroyed using liquid nitrogen: Yes   Cryotherapy cycles:  1 Outcome: patient tolerated procedure well with no complications   Post-procedure details: wound care instructions given    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 Left Antecubital Fossa  Epidermal / dermal shaving  Lesion diameter (cm):  1.2 Informed consent: discussed and consent obtained   Timeout: patient name, date of birth, surgical site, and procedure verified   Procedure prep:  Patient was prepped and draped in usual sterile fashion Prep type:  Chlorhexidine Anesthesia: the lesion was anesthetized in a standard fashion   Anesthetic:  1% lidocaine w/ epinephrine 1-100,000 local infiltration Instrument used: DermaBlade   Hemostasis achieved with: aluminum chloride   Outcome: patient tolerated procedure well   Post-procedure details: sterile dressing applied and wound care instructions given   Dressing type: petrolatum gauze, petrolatum and bandage    Specimen 1 - Surgical pathology Differential Diagnosis: scc Check Margins: yes   I, Kendle Turbin, PA-C, have reviewed all documentation's for this visit.  The documentation on 08/27/20 for the  exam, diagnosis, procedures and orders are all accurate and complete.

## 2020-09-05 DIAGNOSIS — R7309 Other abnormal glucose: Secondary | ICD-10-CM | POA: Diagnosis not present

## 2020-09-05 DIAGNOSIS — R0602 Shortness of breath: Secondary | ICD-10-CM | POA: Diagnosis not present

## 2020-09-05 DIAGNOSIS — Z125 Encounter for screening for malignant neoplasm of prostate: Secondary | ICD-10-CM | POA: Diagnosis not present

## 2020-09-05 DIAGNOSIS — Z23 Encounter for immunization: Secondary | ICD-10-CM | POA: Diagnosis not present

## 2020-09-05 DIAGNOSIS — E782 Mixed hyperlipidemia: Secondary | ICD-10-CM | POA: Diagnosis not present

## 2020-09-05 DIAGNOSIS — I10 Essential (primary) hypertension: Secondary | ICD-10-CM | POA: Diagnosis not present

## 2020-09-05 DIAGNOSIS — R7303 Prediabetes: Secondary | ICD-10-CM | POA: Diagnosis not present

## 2020-09-05 DIAGNOSIS — K219 Gastro-esophageal reflux disease without esophagitis: Secondary | ICD-10-CM | POA: Diagnosis not present

## 2020-09-05 DIAGNOSIS — N183 Chronic kidney disease, stage 3 unspecified: Secondary | ICD-10-CM | POA: Diagnosis not present

## 2020-09-05 DIAGNOSIS — Z0000 Encounter for general adult medical examination without abnormal findings: Secondary | ICD-10-CM | POA: Diagnosis not present

## 2020-09-05 DIAGNOSIS — M1A0720 Idiopathic chronic gout, left ankle and foot, without tophus (tophi): Secondary | ICD-10-CM | POA: Diagnosis not present

## 2020-09-10 ENCOUNTER — Ambulatory Visit
Admission: RE | Admit: 2020-09-10 | Discharge: 2020-09-10 | Disposition: A | Discharge status: Home / Self Care | Payer: Medicare HMO | Source: Ambulatory Visit | Attending: Family Medicine | Admitting: Family Medicine

## 2020-09-10 ENCOUNTER — Other Ambulatory Visit: Payer: Self-pay | Admitting: Family Medicine

## 2020-09-10 ENCOUNTER — Other Ambulatory Visit: Payer: Self-pay

## 2020-09-10 DIAGNOSIS — R0602 Shortness of breath: Secondary | ICD-10-CM | POA: Diagnosis not present

## 2020-10-04 ENCOUNTER — Inpatient Hospital Stay (HOSPITAL_COMMUNITY)
Admission: EM | Admit: 2020-10-04 | Discharge: 2020-10-07 | DRG: 390 | Disposition: A | Discharge status: Home / Self Care | Payer: Medicare HMO | Attending: Internal Medicine | Admitting: Internal Medicine

## 2020-10-04 ENCOUNTER — Other Ambulatory Visit: Payer: Self-pay

## 2020-10-04 ENCOUNTER — Encounter (HOSPITAL_COMMUNITY): Payer: Self-pay

## 2020-10-04 DIAGNOSIS — Z87891 Personal history of nicotine dependence: Secondary | ICD-10-CM

## 2020-10-04 DIAGNOSIS — Z8719 Personal history of other diseases of the digestive system: Secondary | ICD-10-CM | POA: Diagnosis not present

## 2020-10-04 DIAGNOSIS — K56600 Partial intestinal obstruction, unspecified as to cause: Principal | ICD-10-CM | POA: Diagnosis present

## 2020-10-04 DIAGNOSIS — Z20822 Contact with and (suspected) exposure to covid-19: Secondary | ICD-10-CM | POA: Diagnosis present

## 2020-10-04 DIAGNOSIS — Z79899 Other long term (current) drug therapy: Secondary | ICD-10-CM

## 2020-10-04 DIAGNOSIS — Z83438 Family history of other disorder of lipoprotein metabolism and other lipidemia: Secondary | ICD-10-CM

## 2020-10-04 DIAGNOSIS — Z808 Family history of malignant neoplasm of other organs or systems: Secondary | ICD-10-CM

## 2020-10-04 DIAGNOSIS — I10 Essential (primary) hypertension: Secondary | ICD-10-CM | POA: Diagnosis not present

## 2020-10-04 DIAGNOSIS — K5730 Diverticulosis of large intestine without perforation or abscess without bleeding: Secondary | ICD-10-CM | POA: Diagnosis not present

## 2020-10-04 DIAGNOSIS — Z7982 Long term (current) use of aspirin: Secondary | ICD-10-CM | POA: Diagnosis not present

## 2020-10-04 DIAGNOSIS — Z9049 Acquired absence of other specified parts of digestive tract: Secondary | ICD-10-CM | POA: Diagnosis not present

## 2020-10-04 DIAGNOSIS — K219 Gastro-esophageal reflux disease without esophagitis: Secondary | ICD-10-CM | POA: Diagnosis not present

## 2020-10-04 DIAGNOSIS — K6389 Other specified diseases of intestine: Secondary | ICD-10-CM | POA: Diagnosis not present

## 2020-10-04 DIAGNOSIS — E785 Hyperlipidemia, unspecified: Secondary | ICD-10-CM | POA: Diagnosis present

## 2020-10-04 DIAGNOSIS — Z8249 Family history of ischemic heart disease and other diseases of the circulatory system: Secondary | ICD-10-CM | POA: Diagnosis not present

## 2020-10-04 DIAGNOSIS — R52 Pain, unspecified: Secondary | ICD-10-CM

## 2020-10-04 DIAGNOSIS — K56609 Unspecified intestinal obstruction, unspecified as to partial versus complete obstruction: Secondary | ICD-10-CM | POA: Diagnosis not present

## 2020-10-04 DIAGNOSIS — K5669 Other partial intestinal obstruction: Secondary | ICD-10-CM | POA: Diagnosis not present

## 2020-10-04 DIAGNOSIS — K8020 Calculus of gallbladder without cholecystitis without obstruction: Secondary | ICD-10-CM | POA: Diagnosis not present

## 2020-10-04 NOTE — ED Notes (Signed)
Pt to ER complaining of severe abdominal pain 10/10.  Pt reports that he has been eating popcorn, and peanuts and has a history of diverticulosis/divertivculitis.  Respirations even and unlabored.  Will continue to monitor.

## 2020-10-04 NOTE — ED Triage Notes (Signed)
Pt reports a diverticulitis flare up with LLQ pain beginning today at 4pm.

## 2020-10-05 ENCOUNTER — Inpatient Hospital Stay (HOSPITAL_COMMUNITY): Payer: Medicare HMO

## 2020-10-05 ENCOUNTER — Encounter (HOSPITAL_COMMUNITY): Payer: Self-pay

## 2020-10-05 ENCOUNTER — Emergency Department (HOSPITAL_COMMUNITY): Payer: Medicare HMO

## 2020-10-05 DIAGNOSIS — K219 Gastro-esophageal reflux disease without esophagitis: Secondary | ICD-10-CM | POA: Diagnosis present

## 2020-10-05 DIAGNOSIS — Z7982 Long term (current) use of aspirin: Secondary | ICD-10-CM | POA: Diagnosis not present

## 2020-10-05 DIAGNOSIS — Z8249 Family history of ischemic heart disease and other diseases of the circulatory system: Secondary | ICD-10-CM | POA: Diagnosis not present

## 2020-10-05 DIAGNOSIS — I10 Essential (primary) hypertension: Secondary | ICD-10-CM | POA: Diagnosis present

## 2020-10-05 DIAGNOSIS — Z20822 Contact with and (suspected) exposure to covid-19: Secondary | ICD-10-CM | POA: Diagnosis present

## 2020-10-05 DIAGNOSIS — Z79899 Other long term (current) drug therapy: Secondary | ICD-10-CM | POA: Diagnosis not present

## 2020-10-05 DIAGNOSIS — Z87891 Personal history of nicotine dependence: Secondary | ICD-10-CM | POA: Diagnosis not present

## 2020-10-05 DIAGNOSIS — Z9049 Acquired absence of other specified parts of digestive tract: Secondary | ICD-10-CM | POA: Diagnosis not present

## 2020-10-05 DIAGNOSIS — Z808 Family history of malignant neoplasm of other organs or systems: Secondary | ICD-10-CM | POA: Diagnosis not present

## 2020-10-05 DIAGNOSIS — K56600 Partial intestinal obstruction, unspecified as to cause: Secondary | ICD-10-CM | POA: Diagnosis not present

## 2020-10-05 DIAGNOSIS — E785 Hyperlipidemia, unspecified: Secondary | ICD-10-CM | POA: Diagnosis present

## 2020-10-05 DIAGNOSIS — Z8719 Personal history of other diseases of the digestive system: Secondary | ICD-10-CM | POA: Diagnosis not present

## 2020-10-05 DIAGNOSIS — K56609 Unspecified intestinal obstruction, unspecified as to partial versus complete obstruction: Secondary | ICD-10-CM | POA: Diagnosis present

## 2020-10-05 DIAGNOSIS — Z83438 Family history of other disorder of lipoprotein metabolism and other lipidemia: Secondary | ICD-10-CM | POA: Diagnosis not present

## 2020-10-05 LAB — URINALYSIS, ROUTINE W REFLEX MICROSCOPIC
Bilirubin Urine: NEGATIVE
Glucose, UA: NEGATIVE mg/dL
Hgb urine dipstick: NEGATIVE
Ketones, ur: 80 mg/dL — AB
Leukocytes,Ua: NEGATIVE
Nitrite: NEGATIVE
Protein, ur: 30 mg/dL — AB
Specific Gravity, Urine: 1.026 (ref 1.005–1.030)
pH: 8 (ref 5.0–8.0)

## 2020-10-05 LAB — BASIC METABOLIC PANEL
Anion gap: 12 (ref 5–15)
BUN: 15 mg/dL (ref 8–23)
CO2: 24 mmol/L (ref 22–32)
Calcium: 9.5 mg/dL (ref 8.9–10.3)
Chloride: 101 mmol/L (ref 98–111)
Creatinine, Ser: 1.14 mg/dL (ref 0.61–1.24)
GFR, Estimated: >60 mL/min (ref >60)
Glucose, Bld: 128 mg/dL — ABNORMAL HIGH (ref 70–99)
Potassium: 3.7 mmol/L (ref 3.5–5.1)
Sodium: 137 mmol/L (ref 135–145)

## 2020-10-05 LAB — COMPREHENSIVE METABOLIC PANEL
ALT: 30 U/L (ref 0–44)
AST: 29 U/L (ref 15–41)
Albumin: 4.6 g/dL (ref 3.5–5.0)
Alkaline Phosphatase: 64 U/L (ref 38–126)
Anion gap: 14 (ref 5–15)
BUN: 20 mg/dL (ref 8–23)
CO2: 21 mmol/L — ABNORMAL LOW (ref 22–32)
Calcium: 9.9 mg/dL (ref 8.9–10.3)
Chloride: 99 mmol/L (ref 98–111)
Creatinine, Ser: 1.22 mg/dL (ref 0.61–1.24)
GFR, Estimated: >60 mL/min (ref >60)
Glucose, Bld: 151 mg/dL — ABNORMAL HIGH (ref 70–99)
Potassium: 3.9 mmol/L (ref 3.5–5.1)
Sodium: 134 mmol/L — ABNORMAL LOW (ref 135–145)
Total Bilirubin: 3.1 mg/dL — ABNORMAL HIGH (ref 0.3–1.2)
Total Protein: 7.5 g/dL (ref 6.5–8.1)

## 2020-10-05 LAB — RESPIRATORY PANEL BY RT PCR (FLU A&B, COVID)
Influenza A by PCR: NEGATIVE
Influenza B by PCR: NEGATIVE
SARS Coronavirus 2 by RT PCR: NEGATIVE

## 2020-10-05 LAB — CBC
HCT: 38.1 % — ABNORMAL LOW (ref 39.0–52.0)
HCT: 39.1 % (ref 39.0–52.0)
Hemoglobin: 13.4 g/dL (ref 13.0–17.0)
Hemoglobin: 13.6 g/dL (ref 13.0–17.0)
MCH: 32.8 pg (ref 26.0–34.0)
MCH: 33.9 pg (ref 26.0–34.0)
MCHC: 34.3 g/dL (ref 30.0–36.0)
MCHC: 35.7 g/dL (ref 30.0–36.0)
MCV: 95 fL (ref 80.0–100.0)
MCV: 95.8 fL (ref 80.0–100.0)
Platelets: 224 10*3/uL (ref 150–400)
Platelets: 271 10*3/uL (ref 150–400)
RBC: 4.01 MIL/uL — ABNORMAL LOW (ref 4.22–5.81)
RBC: 4.08 MIL/uL — ABNORMAL LOW (ref 4.22–5.81)
RDW: 12.8 % (ref 11.5–15.5)
RDW: 12.9 % (ref 11.5–15.5)
WBC: 12.4 10*3/uL — ABNORMAL HIGH (ref 4.0–10.5)
WBC: 7.1 10*3/uL (ref 4.0–10.5)
nRBC: 0 % (ref 0.0–0.2)
nRBC: 0 % (ref 0.0–0.2)

## 2020-10-05 LAB — LIPASE, BLOOD: Lipase: 29 U/L (ref 11–51)

## 2020-10-05 LAB — LACTIC ACID, PLASMA: Lactic Acid, Venous: 1.3 mmol/L (ref 0.5–1.9)

## 2020-10-05 MED ORDER — ENOXAPARIN SODIUM 40 MG/0.4ML ~~LOC~~ SOLN
40.0000 mg | SUBCUTANEOUS | Status: DC
  Administered 2020-10-05 – 2020-10-07 (×3): 40 mg via SUBCUTANEOUS
  Filled 2020-10-05 (×3): qty 0.4

## 2020-10-05 MED ORDER — CALCIUM CARBONATE ANTACID 500 MG PO CHEW
1.0000 | CHEWABLE_TABLET | Freq: Three times a day (TID) | ORAL | Status: DC
  Administered 2020-10-05 – 2020-10-07 (×5): 200 mg via ORAL
  Filled 2020-10-05 (×5): qty 1

## 2020-10-05 MED ORDER — MORPHINE SULFATE (PF) 4 MG/ML IV SOLN
4.0000 mg | Freq: Once | INTRAVENOUS | Status: AC
  Administered 2020-10-05: 4 mg via INTRAVENOUS
  Filled 2020-10-05: qty 1

## 2020-10-05 MED ORDER — IOHEXOL 300 MG/ML  SOLN
100.0000 mL | Freq: Once | INTRAMUSCULAR | Status: AC | PRN
  Administered 2020-10-05: 100 mL via INTRAVENOUS

## 2020-10-05 MED ORDER — SODIUM CHLORIDE (PF) 0.9 % IJ SOLN
INTRAMUSCULAR | Status: AC
  Filled 2020-10-05: qty 50

## 2020-10-05 MED ORDER — ALUM & MAG HYDROXIDE-SIMETH 200-200-20 MG/5ML PO SUSP
30.0000 mL | Freq: Once | ORAL | Status: AC
  Administered 2020-10-05: 30 mL via ORAL
  Filled 2020-10-05: qty 30

## 2020-10-05 MED ORDER — LACTATED RINGERS IV SOLN: INTRAVENOUS | Status: DC

## 2020-10-05 MED ORDER — BISOPROLOL FUMARATE 5 MG PO TABS
5.0000 mg | ORAL_TABLET | Freq: Every day | ORAL | Status: DC
  Administered 2020-10-05 – 2020-10-07 (×3): 5 mg via ORAL
  Filled 2020-10-05 (×3): qty 1

## 2020-10-05 MED ORDER — ALUM & MAG HYDROXIDE-SIMETH 200-200-20 MG/5ML PO SUSP
30.0000 mL | ORAL | Status: DC | PRN
  Administered 2020-10-05: 16:00:00 30 mL via ORAL
  Filled 2020-10-05: qty 30

## 2020-10-05 MED ORDER — PANTOPRAZOLE SODIUM 40 MG PO TBEC: 40.0000 mg | DELAYED_RELEASE_TABLET | Freq: Every day | ORAL | Status: DC

## 2020-10-05 MED ORDER — ONDANSETRON HCL 4 MG/2ML IJ SOLN
4.0000 mg | Freq: Four times a day (QID) | INTRAMUSCULAR | Status: DC | PRN
  Administered 2020-10-05 (×3): 4 mg via INTRAVENOUS
  Filled 2020-10-05 (×3): qty 2

## 2020-10-05 MED ORDER — HYDRALAZINE HCL 20 MG/ML IJ SOLN: 10.0000 mg | INTRAMUSCULAR | Status: DC | PRN

## 2020-10-05 MED ORDER — LACTATED RINGERS IV BOLUS
1000.0000 mL | Freq: Once | INTRAVENOUS | Status: AC
  Administered 2020-10-05: 1000 mL via INTRAVENOUS

## 2020-10-05 MED ORDER — PANTOPRAZOLE SODIUM 40 MG PO TBEC
40.0000 mg | DELAYED_RELEASE_TABLET | Freq: Once | ORAL | Status: AC
  Administered 2020-10-05: 40 mg via ORAL
  Filled 2020-10-05: qty 1

## 2020-10-05 MED ORDER — ONDANSETRON HCL 4 MG/2ML IJ SOLN
4.0000 mg | Freq: Once | INTRAMUSCULAR | Status: AC
  Administered 2020-10-05: 4 mg via INTRAVENOUS
  Filled 2020-10-05: qty 2

## 2020-10-05 MED ORDER — BISOPROLOL FUMARATE 5 MG PO TABS
2.5000 mg | ORAL_TABLET | Freq: Every day | ORAL | Status: DC
  Administered 2020-10-05 – 2020-10-06 (×2): 2.5 mg via ORAL
  Filled 2020-10-05 (×2): qty 1

## 2020-10-05 MED ORDER — ONDANSETRON HCL 4 MG PO TABS: 4.0000 mg | ORAL_TABLET | Freq: Four times a day (QID) | ORAL | Status: DC | PRN

## 2020-10-05 MED ORDER — PANTOPRAZOLE SODIUM 40 MG PO TBEC
40.0000 mg | DELAYED_RELEASE_TABLET | Freq: Two times a day (BID) | ORAL | Status: DC
  Administered 2020-10-05: 40 mg via ORAL
  Filled 2020-10-05: qty 1

## 2020-10-05 MED ORDER — MORPHINE SULFATE (PF) 4 MG/ML IV SOLN
4.0000 mg | INTRAVENOUS | Status: DC | PRN
  Administered 2020-10-05 (×3): 4 mg via INTRAVENOUS
  Filled 2020-10-05 (×3): qty 1

## 2020-10-05 NOTE — H&P (Signed)
History and Physical    Jeffrey Wong:[id number] DOB: [birth year] DOA: 10/04/2020  PCP: Shirline Frees, MD  Patient coming from: HTN  I have personally briefly reviewed patient's old medical records in [place]  Chief Complaint: Abd pain  HPI: Jeffrey Wong is a 74 y.o. male with medical history significant of HTN, diverticulitis.  Pt presents to ED with c/o abd pain.  Onset yesterday morning.  Has become severe, 10/10, radiates to back occasionally.  Has had associated N/V.  1 episode of diarrhea.  No blood in stool nor emesis.  No fevers, chills.  Pain similar to diverticulitis about 5 years ago.   ED Course: CT reveals that patient actually has PSBO with transition point in RLQ.  No H/O SBO in the past.  Does have h/o appendectomy 20+ years ago.   Review of Systems: As per HPI, otherwise all review of systems negative.  Past Medical History:  Diagnosis Date  . Arthritis   . 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 Diverticulitis   . DJD (degenerative joint disease)   . Esophageal reflux   . Essential hypertension, benign   . Other and unspecified hyperlipidemia   . Vertigo 11/21/2015    Past Surgical History:  Procedure Laterality Date  . APPENDECTOMY       reports that he quit smoking about 36 years ago. His smoking use included cigarettes. He has a 15.00 pack-year smoking history. He has never used smokeless tobacco. He reports current alcohol use. He reports that he does not use drugs.  No Known Allergies  Family History  Problem Relation Age of Onset  . Hyperlipidemia Mother   . Hypertension Mother   . Heart disease Mother   . Hyperlipidemia Father   . Other Father        tumor  . Brain cancer Father   . Hyperlipidemia Sister   . Hypertension Sister      Prior to Admission medications   Medication Sig Start Date End Date Taking? Authorizing Provider  albuterol (VENTOLIN HFA) 108 (90 Base)  MCG/ACT inhaler Inhale into the lungs. 09/07/20  Yes [provider]  bisoprolol (ZEBETA) 5 MG tablet Take 5 mg by mouth See admin instructions. Takes 1 whole tablet in the morning and 1/2 tablet at night 09/10/15  Yes [provider]  cholecalciferol (VITAMIN D3) 25 MCG (1000 UNIT) tablet Take 1,000 Units by mouth daily.   Yes [provider]  GARLIC PO Take 1 tablet by mouth daily.   Yes [provider]  Nutritional Supplements (JUICE PLUS FIBRE PO) Take by mouth.   Yes [provider]  pantoprazole (PROTONIX) 40 MG tablet Take 1 tablet (40 mg total) by mouth daily. Take 30-60 min before first meal of the day 02/10/16  Yes Tanda Rockers, MD  traMADol Veatrice Bourbon) 50 MG tablet 1-2 every 4 hours as needed for cough or pain 02/10/16  Yes Tanda Rockers, MD  Turmeric (QC TUMERIC COMPLEX PO) Take 1 tablet by mouth daily.   Yes [provider]  vitamin B-12 (CYANOCOBALAMIN) 1000 MCG tablet Take 1,000 mcg by mouth daily.   Yes [provider]  aspirin 81 MG chewable tablet Chew by mouth daily. Patient not taking: Reported on 10/05/2020    [provider]  bisoprolol-hydrochlorothiazide [facility]) 2.5-6.25 MG tablet Take by mouth.  10/05/20  [provider]  famotidine (PEPCID) 20 MG tablet One at bedtime Patient not taking: Reported on  10/05/2020 02/10/16 10/05/20  Tanda Rockers, MD    Physical Exam: Vitals:   10/05/20 0200 10/05/20 0223 10/05/20 0300 10/05/20 0333  BP: (!) 179/78 (!) 179/78 (!) 161/87 (!) 161/87  Pulse: 80 77 77 76  Resp:  16  18  Temp:      TempSrc:      SpO2: 98% 98% 95% 99%  Weight:      Height:        Constitutional: NAD, calm, comfortable Eyes: PERRL, lids and conjunctivae normal ENMT: Mucous membranes are moist. Posterior pharynx clear of any exudate or lesions.Normal dentition.  Neck: normal, supple, no masses, no thyromegaly Respiratory: clear to auscultation bilaterally, no wheezing, no  crackles. Normal respiratory effort. No accessory muscle use.  Cardiovascular: Regular rate and rhythm, no murmurs / rubs / gallops. No extremity edema. 2+ pedal pulses. No carotid bruits.  Abdomen: no tenderness, no masses palpated. No hepatosplenomegaly. Bowel sounds positive.  Musculoskeletal: no clubbing / cyanosis. No joint deformity upper and lower extremities. Good ROM, no contractures. Normal muscle tone.  Skin: no rashes, lesions, ulcers. No induration Neurologic: CN 2-12 grossly intact. Sensation intact, DTR normal. Strength 5/5 in all 4.  Psychiatric: Normal judgment and insight. Alert and oriented x 3. Normal mood.    Labs on Admission: I have personally reviewed following labs and imaging studies  CBC: Recent Labs  Lab 10/04/20 2341  WBC 12.4*  HGB 13.6  HCT 38.1*  MCV 95.0  PLT 962   Basic Metabolic Panel: Recent Labs  Lab 10/04/20 2341  NA 134*  K 3.9  CL 99  CO2 21*  GLUCOSE 151*  BUN 20  CREATININE 1.22  CALCIUM 9.9   GFR: Estimated Creatinine Clearance: 56.5 mL/min (by C-G formula based on SCr of 1.22 mg/dL). Liver Function Tests: Recent Labs  Lab 10/04/20 2341  AST 29  ALT 30  ALKPHOS 64  BILITOT 3.1*  PROT 7.5  ALBUMIN 4.6   Recent Labs  Lab 10/04/20 2341  LIPASE 29   No results for input(s): AMMONIA in the last 168 hours. Coagulation Profile: No results for input(s): INR, PROTIME in the last 168 hours. Cardiac Enzymes: No results for input(s): CKTOTAL, CKMB, CKMBINDEX, TROPONINI in the last 168 hours. BNP (last 3 results) No results for input(s): PROBNP in the last 8760 hours. HbA1C: No results for input(s): HGBA1C in the last 72 hours. CBG: No results for input(s): GLUCAP in the last 168 hours. Lipid Profile: No results for input(s): CHOL, HDL, LDLCALC, TRIG, CHOLHDL, LDLDIRECT in the last 72 hours. Thyroid Function Tests: No results for input(s): TSH, T4TOTAL, FREET4, T3FREE, THYROIDAB in the last 72 hours. Anemia Panel: No  results for input(s): VITAMINB12, FOLATE, FERRITIN, TIBC, IRON, RETICCTPCT in the last 72 hours. Urine analysis:    Component Value Date/Time   COLORURINE YELLOW 10/04/2020 2341   APPEARANCEUR CLEAR 10/04/2020 2341   LABSPEC 1.026 10/04/2020 2341   PHURINE 8.0 10/04/2020 [place] 10/04/2020 2341   HGBUR NEGATIVE 10/04/2020 2341   BILIRUBINUR NEGATIVE 10/04/2020 2341   KETONESUR 80 (A) 10/04/2020 2341   PROTEINUR 30 (A) 10/04/2020 2341   NITRITE NEGATIVE 10/04/2020 2341   LEUKOCYTESUR NEGATIVE 10/04/2020 2341    Radiological Exams on Admission: CT ABDOMEN PELVIS W CONTRAST  Result Date: 10/05/2020 CLINICAL DATA:  Left lower quadrant pain EXAM: CT ABDOMEN AND PELVIS WITH CONTRAST TECHNIQUE: Multidetector CT imaging of the abdomen and pelvis was performed using the standard protocol following bolus administration of intravenous contrast. CONTRAST:  112mL OMNIPAQUE IOHEXOL 300 MG/ML  SOLN COMPARISON:  None. FINDINGS: LOWER CHEST: Normal. HEPATOBILIARY: Normal hepatic contours. No intra- or extrahepatic biliary dilatation. There is cholelithiasis without acute inflammation. PANCREAS: Normal pancreas. No ductal dilatation or peripancreatic fluid collection. SPLEEN: Normal. ADRENALS/URINARY TRACT: The adrenal glands are normal. No hydronephrosis, nephroureterolithiasis or solid renal mass. The urinary bladder is normal for degree of distention STOMACH/BOWEL: There is no hiatal hernia. Normal duodenal course and caliber. Proximal small bowel is dilated. There is a relative transition point in the right lower quadrant (series image 61) where there is a focal area narrowing with fecalization proximal small contents. Sigmoid diverticulosis acute inflammation. Status post appendectomy. VASCULAR/LYMPHATIC: There is calcific atherosclerosis of the abdominal aorta. No lymphadenopathy. REPRODUCTIVE: Normal prostate size with symmetric seminal vesicles. MUSCULOSKELETAL. No bony spinal canal  stenosis or focal osseous abnormality. OTHER: None. IMPRESSION: 1. Partial small-bowel obstruction with relative transition point in the right lower quadrant at a site of focal narrowing with fecalization of the more proximal small bowel contents. 2. Sigmoid diverticulosis without acute inflammation. Aortic Atherosclerosis (ICD10-I70.0). Electronically Signed   By: Ulyses Jarred M.D.   On: 10/05/2020 02:14    EKG: Independently reviewed.  Assessment/Plan Principal Problem:   Partial small bowel obstruction (HCC) Active Problems:   HTN (hypertension)    1. PSBO - 1. NPO except sips with meds 2. IVF: LR at 100 3. zofran PRN 4. Holding off on NGT for the moment as not having vomiting in ED 5. Call gen surg in AM 2. HTN - 1. Cont bisoprolol  DVT prophylaxis: Lovenox Code Status: Full Family Communication: No family in room Disposition Plan: Home after SBO resolved Consults called: None, call gen surg in AM Admission status: Admit to inpatient  Severity of Illness: The appropriate patient status for this patient is INPATIENT. Inpatient status is judged to be reasonable and necessary in order to provide the required intensity of service to ensure the patient's safety. The patient's presenting symptoms, physical exam findings, and initial radiographic and laboratory data in the context of their chronic comorbidities is felt to place them at high risk for further clinical deterioration. Furthermore, it is not anticipated that the patient will be medically stable for discharge from the hospital within 2 midnights of admission. The following factors support the patient status of inpatient.   IP status due to small bowel obstruction, NPO status.   * I certify that at the point of admission it is my clinical judgment that the patient will require inpatient hospital care spanning beyond 2 midnights from the point of admission due to high intensity of service, high risk for further deterioration  and high frequency of surveillance required.*    Hanya Guerin M. DO [facility]  How to contact the [facility] Attending or Consulting provider [place] or covering provider during after hours [place], for this patient?  1. Check the care team in [facility] and look for a) attending/consulting TR[facility] provider listed and b) the [facility] team listed 2. Log into [email or web address]  Amion Physician Scheduling and messaging for groups and whole hospitals  On call and physician scheduling software for group practices, residents, hospitalists and other medical providers for call, clinic, rotation and shift schedules. OnCall Enterprise is a hospital-wide system for scheduling doctors and paging doctors on call. EasyPlot is for scientific plotting and data analysis.  [email or web address]  and use [place]'s universal password to access. If you do not have the password, please contact the hospital operator.  3.  Locate the [facility] provider you are looking for under [facility] and page to a number that you can be directly reached. 4. If you still have difficulty reaching the provider, please page the [facility] (Director on Call) for the Hospitalists listed on amion for assistance.  10/05/2020, 4:11 AM

## 2020-10-05 NOTE — Progress Notes (Signed)
PROGRESS NOTE  Jeffrey Wong GBT:[id number] DOB: 05-10-1946 DOA: 10/04/2020 PCP: Shirline Frees, MD   LOS: 0 days   Brief Narrative / Interim history: 74 year old male with history of hypertension, diverticulitis, came to the hospital with abdominal pain with onset 10/30 in the afternoon, as well as nausea and vomiting.  Feels like the pain was similar to his prior episode of diverticulitis.  CT scan in the ED showed small bowel obstruction with transition point in the right lower quadrant, and no evidence of diverticulitis.  Subjective / 24h Interval events: Abdomen still sore this morning, has nausea but has not been throwing up.  Not passing gas  Assessment & Plan: Principal Problem Small bowel obstruction with transition point-continue conservative management, patient no longer has vomiting and NG tube has not been placed.  Hold for now.  General surgery consulted, appreciate input.  Active Problems Essential hypertension-continue bisoprolol  GERD-on PPI  Scheduled Meds: . bisoprolol  2.5 mg Oral QHS  . bisoprolol  5 mg Oral Daily  . enoxaparin (LOVENOX) injection  40 mg Subcutaneous Q24H  . [START ON 10/06/2020] pantoprazole  40 mg Oral Daily  . sodium chloride (PF)       Continuous Infusions: . lactated ringers 100 mL/hr at 10/05/20 0455   PRN Meds:.morphine injection, ondansetron **OR** ondansetron (ZOFRAN) IV  Diet Orders (From admission, onward)    Start     Ordered   10/05/20 0319  Diet NPO time specified Except for: Sips with Meds  Diet effective now       Question:  Except for  Answer:  Sips with Meds   10/05/20 0318          DVT prophylaxis: enoxaparin (LOVENOX) injection 40 mg Start: 10/05/20 1000     Code Status: Full Code  Family Communication: no family present  Status is: Inpatient  Remains inpatient appropriate because:IV treatments appropriate due to intensity of illness or inability to take PO   Dispo: The patient is from: Home               Anticipated d/c is to: Home              Anticipated d/c date is: 2 days              Patient currently is not medically stable to d/c.  Consultants:  General surgery  Procedures:  None   Microbiology  None   Antimicrobials: None     Objective: Vitals:   10/05/20 0300 10/05/20 0333 10/05/20 0400 10/05/20 0459  BP: (!) 161/87 (!) 161/87 (!) 159/87 (!) 157/86  Pulse: 77 76 67 70  Resp:  18  14  Temp:      TempSrc:      SpO2: 95% 99% 97% 99%  Weight:      Height:        Intake/Output Summary (Last 24 hours) at 10/05/2020 0915 Last data filed at 10/05/2020 0600 Gross per 24 hour  Intake 1000 ml  Output 550 ml  Net 450 ml   Filed Weights   10/04/20 2122  Weight: 85.3 kg    Examination:  Constitutional: NAD Eyes: no scleral icterus ENMT: Mucous membranes are moist.  Neck: normal, supple Respiratory: clear to auscultation bilaterally, no wheezing, no crackles. Normal respiratory effort. No accessory muscle use.  Cardiovascular: Regular rate and rhythm, no murmurs / rubs / gallops. No LE edema. Good peripheral pulses Abdomen: non distended, mild tenderness throughout, no guarding or rebound Musculoskeletal: no clubbing /  cyanosis.  Skin: no rashes Neurologic: Nonfocal   Data Reviewed: I have independently reviewed following labs and imaging studies   CBC: Recent Labs  Lab 10/04/20 2341  WBC 12.4*  HGB 13.6  HCT 38.1*  MCV 95.0  PLT 010   Basic Metabolic Panel: Recent Labs  Lab 10/04/20 2341  NA 134*  K 3.9  CL 99  CO2 21*  GLUCOSE 151*  BUN 20  CREATININE 1.22  CALCIUM 9.9   Liver Function Tests: Recent Labs  Lab 10/04/20 2341  AST 29  ALT 30  ALKPHOS 64  BILITOT 3.1*  PROT 7.5  ALBUMIN 4.6   Coagulation Profile: No results for input(s): INR, PROTIME in the last 168 hours. HbA1C: No results for input(s): HGBA1C in the last 72 hours. CBG: No results for input(s): GLUCAP in the last 168 hours.  Recent Results (from the past  240 hour(s))  Respiratory Panel by RT PCR (Flu A&B, Covid) - Nasopharyngeal Swab     Status: None   Collection Time: 10/05/20  2:38 AM   Specimen: Nasopharyngeal Swab  Result Value Ref Range Status   SARS Coronavirus 2 by RT PCR NEGATIVE NEGATIVE Final    Comment: (NOTE) SARS-CoV-2 target nucleic acids are NOT DETECTED.  The SARS-CoV-2 RNA is generally detectable in upper respiratoy specimens during the acute phase of infection. The lowest concentration of SARS-CoV-2 viral copies this assay can detect is 131 copies/mL. A negative result does not preclude SARS-Cov-2 infection and should not be used as the sole basis for treatment or other patient management decisions. A negative result may occur with  improper specimen collection/handling, submission of specimen other than nasopharyngeal swab, presence of viral mutation(s) within the areas targeted by this assay, and inadequate number of viral copies (<131 copies/mL). A negative result must be combined with clinical observations, patient history, and epidemiological information. The expected result is Negative.  Fact Sheet for Patients:  PinkCheek.be  Fact Sheet for Healthcare Providers:  GravelBags.it  This test is no t yet approved or cleared by the [place] FDA and  has been authorized for detection and/or diagnosis of SARS-CoV-2 by FDA under an Emergency Use Authorization (EUA). This EUA will remain  in effect (meaning this test can be used) for the duration of the COVID-19 declaration under Section 564(b)(1) of the Act, 21 U.S.C. section 360bbb-3(b)(1), unless the authorization is terminated or revoked sooner.     Influenza A by PCR NEGATIVE NEGATIVE Final   Influenza B by PCR NEGATIVE NEGATIVE Final    Comment: (NOTE) The Xpert Xpress SARS-CoV-2/FLU/RSV assay is intended as an aid in  the diagnosis of influenza from Nasopharyngeal swab specimens and  should  not be used as a sole basis for treatment. Nasal washings and  aspirates are unacceptable for Xpert Xpress SARS-CoV-2/FLU/RSV  testing.  Fact Sheet for Patients: PinkCheek.be  Fact Sheet for Healthcare Providers: GravelBags.it  This test is not yet approved or cleared by the [place] FDA and  has been authorized for detection and/or diagnosis of SARS-CoV-2 by  FDA under an Emergency Use Authorization (EUA). This EUA will remain  in effect (meaning this test can be used) for the duration of the  Covid-19 declaration under Section 564(b)(1) of the Act, 21  U.S.C. section 360bbb-3(b)(1), unless the authorization is  terminated or revoked. Performed at [facility], [place] [address] [place], [place] [address]      Radiology Studies: CT ABDOMEN PELVIS W CONTRAST  Result Date: 10/05/2020 CLINICAL DATA:  Left lower quadrant pain EXAM: CT ABDOMEN AND PELVIS WITH CONTRAST TECHNIQUE: Multidetector CT imaging of the abdomen and pelvis was performed using the standard protocol following bolus administration of intravenous contrast. CONTRAST:  168mL OMNIPAQUE IOHEXOL 300 MG/ML  SOLN COMPARISON:  None. FINDINGS: LOWER CHEST: Normal. HEPATOBILIARY: Normal hepatic contours. No intra- or extrahepatic biliary dilatation. There is cholelithiasis without acute inflammation. PANCREAS: Normal pancreas. No ductal dilatation or peripancreatic fluid collection. SPLEEN: Normal. ADRENALS/URINARY TRACT: The adrenal glands are normal. No hydronephrosis, nephroureterolithiasis or solid renal mass. The urinary bladder is normal for degree of distention STOMACH/BOWEL: There is no hiatal hernia. Normal duodenal course and caliber. Proximal small bowel is dilated. There is a relative transition point in the right lower quadrant (series image 61) where there is a focal area narrowing with fecalization proximal small contents. Sigmoid  diverticulosis acute inflammation. Status post appendectomy. VASCULAR/LYMPHATIC: There is calcific atherosclerosis of the abdominal aorta. No lymphadenopathy. REPRODUCTIVE: Normal prostate size with symmetric seminal vesicles. MUSCULOSKELETAL. No bony spinal canal stenosis or focal osseous abnormality. OTHER: None. IMPRESSION: 1. Partial small-bowel obstruction with relative transition point in the right lower quadrant at a site of focal narrowing with fecalization of the more proximal small bowel contents. 2. Sigmoid diverticulosis without acute inflammation. Aortic Atherosclerosis (ICD10-I70.0). Electronically Signed   By: Ulyses Jarred M.D.   On: 10/05/2020 02:14    Marzetta Board, MD, PhD [facility]  Between 7 am - 7 pm I am available, please contact me via Amion or Securechat  Between 7 pm - 7 am I am not available, please contact night coverage MD/APP via Amion

## 2020-10-05 NOTE — ED Notes (Signed)
Report given to [facility], RN.  Pt SBAR information covered with receiving nurse.  NO additional questions were asked at this time.  PT resting quietly in bed.  NADN.

## 2020-10-05 NOTE — ED Provider Notes (Signed)
[facility] DEPT Provider Note   CSN: [id number] Arrival date & time: 10/04/20  2104   History Chief Complaint  Patient presents with  . Abdominal Pain    Jeffrey Wong is a 74 y.o. male.  The history is provided by the patient.  Abdominal Pain He has history of hypertension and comes in because of abdominal pain which started this morning.  Pain has become severe and he rates it at 10/10.  It does occasionally radiate to the back.  There has been associated nausea and vomiting.  Pain is not improved following emesis.  He had one episode of diarrhea.  There was no blood in stool or emesis.  He denies fever, chills, sweats.  Pain is similar to what he had with an episode of diverticulitis about 5 years ago.  Past Medical History:  Diagnosis Date  . Arthritis   . 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 Diverticulitis   . DJD (degenerative joint disease)   . Esophageal reflux   . Essential hypertension, benign   . Other and unspecified hyperlipidemia   . Vertigo 11/21/2015    Patient Active Problem List   Diagnosis Date Noted  . Complete tear of left rotator cuff 07/18/2017  . Upper airway cough syndrome 02/10/2016  . Vertigo 11/21/2015    Past Surgical History:  Procedure Laterality Date  . APPENDECTOMY         Family History  Problem Relation Age of Onset  . Hyperlipidemia Mother   . Hypertension Mother   . Heart disease Mother   . Hyperlipidemia Father   . Other Father        tumor  . Brain cancer Father   . Hyperlipidemia Sister   . Hypertension Sister     Social History   Tobacco Use  . Smoking status: Former Smoker    Packs/day: 1.00    Years: 15.00    Pack years: 15.00    Types: Cigarettes    Quit date: 12/07/1983    Years since quitting: 36.8  . Smokeless tobacco: Never Used  Substance Use Topics  . Alcohol use: Yes    Alcohol/week: 0.0 standard drinks    Comment: Rarely  .  Drug use: No    Home Medications Prior to Admission medications   Medication Sig Start Date End Date Taking? Authorizing Provider  aspirin 81 MG chewable tablet Chew by mouth daily.    [provider]  bisoprolol (ZEBETA) 5 MG tablet Take 2.5 tablets by mouth daily. 09/10/15   [provider]  bisoprolol-hydrochlorothiazide (ZIAC) 2.5-6.25 MG tablet Take by mouth.    [provider]  famotidine (PEPCID) 20 MG tablet One at bedtime 02/10/16   Tanda Rockers, MD  Nutritional Supplements (JUICE PLUS FIBRE PO) Take by mouth.    [provider]  omeprazole (PRILOSEC) 40 MG capsule Take 40 mg by mouth daily as needed.     [provider]  pantoprazole (PROTONIX) 40 MG tablet Take 1 tablet (40 mg total) by mouth daily. Take 30-60 min before first meal of the day 02/10/16   Tanda Rockers, MD  traMADol Veatrice Bourbon) 50 MG tablet 1-2 every 4 hours as needed for cough or pain 02/10/16   Tanda Rockers, MD  vitamin B-12 (CYANOCOBALAMIN) 1000 MCG tablet Take 1,000 mcg by mouth daily.    [provider]    Allergies    Patient has no known allergies.  Review  of Systems   Review of Systems  Gastrointestinal: Positive for abdominal pain.  All other systems reviewed and are negative.   Physical Exam Updated Vital Signs BP (!) 170/82   Pulse 69   Temp 98.9 F (37.2 C) (Oral)   Resp 15   Ht 5\' 10"  (1.778 m)   Wt 85.3 kg   SpO2 100%   BMI 26.98 kg/m   Physical Exam Vitals and nursing note reviewed.   74 year old male, resting comfortably and in no acute distress. Vital signs are significant for elevated blood pressure. Oxygen saturation is 100%, which is normal. Head is normocephalic and atraumatic. PERRLA, EOMI. Oropharynx is clear. Neck is nontender and supple without adenopathy or JVD. Back is nontender and there is no CVA tenderness. Lungs are clear without rales, wheezes, or rhonchi. Chest is nontender. Heart has regular rate and rhythm  without murmur. Abdomen is soft, flat, with moderate tenderness in the left mid and lower abdomen.  There is no rebound or guarding.  There are no masses or hepatosplenomegaly and peristalsis is hypoactive. Extremities have no cyanosis or edema, full range of motion is present. Skin is warm and dry without rash. Neurologic: Mental status is normal, cranial nerves are intact, there are no motor or sensory deficits.  ED Results / Procedures / Treatments   Labs (all labs ordered are listed, but only abnormal results are displayed) Labs Reviewed  COMPREHENSIVE METABOLIC PANEL - Abnormal; Notable for the following components:      Result Value   Sodium 134 (*)    CO2 21 (*)    Glucose, Bld 151 (*)    Total Bilirubin 3.1 (*)    All other components within normal limits  CBC - Abnormal; Notable for the following components:   WBC 12.4 (*)    RBC 4.01 (*)    HCT 38.1 (*)    All other components within normal limits  URINALYSIS, ROUTINE W REFLEX MICROSCOPIC - Abnormal; Notable for the following components:   Ketones, ur 80 (*)    Protein, ur 30 (*)    Bacteria, UA RARE (*)    All other components within normal limits  RESPIRATORY PANEL BY RT PCR (FLU A&B, COVID)  LIPASE, BLOOD   Radiology CT ABDOMEN PELVIS W CONTRAST  Result Date: 10/05/2020 CLINICAL DATA:  Left lower quadrant pain EXAM: CT ABDOMEN AND PELVIS WITH CONTRAST TECHNIQUE: Multidetector CT imaging of the abdomen and pelvis was performed using the standard protocol following bolus administration of intravenous contrast. CONTRAST:  123mL OMNIPAQUE IOHEXOL 300 MG/ML  SOLN COMPARISON:  None. FINDINGS: LOWER CHEST: Normal. HEPATOBILIARY: Normal hepatic contours. No intra- or extrahepatic biliary dilatation. There is cholelithiasis without acute inflammation. PANCREAS: Normal pancreas. No ductal dilatation or peripancreatic fluid collection. SPLEEN: Normal. ADRENALS/URINARY TRACT: The adrenal glands are normal. No hydronephrosis,  nephroureterolithiasis or solid renal mass. The urinary bladder is normal for degree of distention STOMACH/BOWEL: There is no hiatal hernia. Normal duodenal course and caliber. Proximal small bowel is dilated. There is a relative transition point in the right lower quadrant (series image 61) where there is a focal area narrowing with fecalization proximal small contents. Sigmoid diverticulosis acute inflammation. Status post appendectomy. VASCULAR/LYMPHATIC: There is calcific atherosclerosis of the abdominal aorta. No lymphadenopathy. REPRODUCTIVE: Normal prostate size with symmetric seminal vesicles. MUSCULOSKELETAL. No bony spinal canal stenosis or focal osseous abnormality. OTHER: None. IMPRESSION: 1. Partial small-bowel obstruction with relative transition point in the right lower quadrant at a site of focal narrowing with  fecalization of the more proximal small bowel contents. 2. Sigmoid diverticulosis without acute inflammation. Aortic Atherosclerosis (ICD10-I70.0). Electronically Signed   By: Ulyses Jarred M.D.   On: 10/05/2020 02:14    Procedures Procedures   Medications Ordered in ED Medications  ondansetron (ZOFRAN) injection 4 mg (has no administration in time range)  morphine 4 MG/ML injection 4 mg (has no administration in time range)  lactated ringers bolus 1,000 mL (has no administration in time range)    ED Course  I have reviewed the triage vital signs and the nursing notes.  Pertinent labs & imaging results that were available during my care of the patient were reviewed by me and considered in my medical decision making (see chart for details).  MDM Rules/Calculators/A&P Left lower quadrant pain, probable exacerbation of diverticulitis.  Consider urolithiasis, abdominal aortic aneurysm, urinary tract infection.  Old records are reviewed, and there are no relevant past visits, no prior abdominal imaging studies on record.  CT scan shows partial small bowel obstruction.  This  apparently is what is causing his pain.  Case is discussed with Dr. Alcario Drought of [facility], who agrees to admit the patient.  Final Clinical Impression(s) / ED Diagnoses Final diagnoses:  Small bowel obstruction [facility])    Rx / DC Orders ED Discharge Orders    None       Delora Fuel, MD 46/27/03 [phone]

## 2020-10-05 NOTE — Consult Note (Signed)
[place] [birth year]  [id number].    Chief Complaint/Reason for Consult: Small bowel obstruction  HPI:  [name] is a 74 yo male who presented with abdominal pain, nausea and vomiting. His symptoms began yesterday afternoon with left sided abdominal pain, and he then developed nausea/vomiting, as well as diarrhea. A few years ago he had diverticulitis with similar symptoms, so he went to the ED for evaluation. A CT scan showed focal inflammation of the ileum with proximal small bowel dilation, concerning for obstruction. He was admitted and general surgery is consulted for further recommendations. This morning the patient still reports abdominal pain, but he has not had any further nausea or vomiting.   His only prior surgery is an appendectomy 20 years ago for perforated appendicitis. Patient denies a family history of inflammatory bowel disease. He has had one prior episode of diverticulitis. His last colonoscopy was 8 years ago and he was told he would be due for the next one in 10 years.  ROS: Review of Systems  Constitutional: Negative for chills and fever.  Respiratory: Negative for shortness of breath and wheezing.   Cardiovascular: Negative for chest pain.  Gastrointestinal: Positive for abdominal pain, diarrhea, nausea and vomiting.  Genitourinary: Negative for hematuria.  Musculoskeletal: Negative for falls.  Neurological: Negative for dizziness and seizures.  Psychiatric/Behavioral: Negative for memory loss. The patient does not have insomnia.     Family History  Problem Relation Age of Onset  . Hyperlipidemia Mother   . Hypertension Mother   . Heart disease Mother   . Hyperlipidemia Father   . Other Father        tumor  . Brain cancer Father   . Hyperlipidemia Sister   . Hypertension Sister     Past Medical History:  Diagnosis Date  . Arthritis   . 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 Diverticulitis   . DJD  (degenerative joint disease)   . Esophageal reflux   . Essential hypertension, benign   . Other and unspecified hyperlipidemia   . Vertigo 11/21/2015    Past Surgical History:  Procedure Laterality Date  . APPENDECTOMY      Social History:  reports that he quit smoking about 36 years ago. His smoking use included cigarettes. He has a 15.00 pack-year smoking history. He has never used smokeless tobacco. He reports current alcohol use. He reports that he does not use drugs.  Allergies: No Known Allergies  Medications Prior to Admission  Medication Sig Dispense Refill  . albuterol (VENTOLIN HFA) 108 (90 Base) MCG/ACT inhaler Inhale into the lungs.    . bisoprolol (ZEBETA) 5 MG tablet Take 5 mg by mouth See admin instructions. Takes 1 whole tablet in the morning and 1/2 tablet at night    . cholecalciferol (VITAMIN D3) 25 MCG (1000 UNIT) tablet Take 1,000 Units by mouth daily.    Marland Kitchen GARLIC PO Take 1 tablet by mouth daily.    . Nutritional Supplements (JUICE PLUS FIBRE PO) Take by mouth.    . pantoprazole (PROTONIX) 40 MG tablet Take 1 tablet (40 mg total) by mouth daily. Take 30-60 min before first meal of the day 30 tablet 2  . traMADol (ULTRAM) 50 MG tablet 1-2 every 4 hours as needed for cough or pain 40 tablet 0  . Turmeric (QC TUMERIC COMPLEX PO) Take 1 tablet by mouth daily.    . vitamin B-12 (CYANOCOBALAMIN) 1000 MCG tablet Take 1,000 mcg  by mouth daily.    Marland Kitchen aspirin 81 MG chewable tablet Chew by mouth daily. (Patient not taking: Reported on 10/05/2020)       Physical Exam: Blood pressure (!) 157/86, pulse 70, temperature 98.9 F (37.2 C), temperature source Oral, resp. rate 14, height 5\' 10"  (1.778 m), weight 85.3 kg, SpO2 99 %. General: resting comfortably, appears stated age, no apparent distress Neurological: alert and oriented, no focal deficits, cranial nerves grossly in tact HEENT: normocephalic, atraumatic, oropharynx clear, no scleral icterus CV: regular rate and  rhythm, no murmurs, extremities warm and well-perfused Respiratory: normal work of breathing, lungs clear to auscultation bilaterally, symmetric chest wall expansion Abdomen: soft, nondistended, mildly tender to palpation in LLQ but no rebound tenderness or guarding. Small umbilical hernia. Extremities: warm and well-perfused, no deformities, moving all extremities spontaneously Psychiatric: normal mood and affect Skin: warm and dry, no jaundice, no rashes or lesions   Results for orders placed or performed during the hospital encounter of 10/04/20 (from the past 48 hour(s))  Lipase, blood     Status: None   Collection Time: 10/04/20 11:41 PM  Result Value Ref Range   Lipase 29 11 - 51 U/L    Comment: Performed at [facility], [place] [address], [place], [place] [address]  Comprehensive metabolic panel     Status: Abnormal   Collection Time: 10/04/20 11:41 PM  Result Value Ref Range   Sodium 134 (L) 135 - 145 mmol/L   Potassium 3.9 3.5 - 5.1 mmol/L   Chloride 99 98 - 111 mmol/L   CO2 21 (L) 22 - 32 mmol/L   Glucose, Bld 151 (H) 70 - 99 mg/dL    Comment: Glucose reference range applies only to samples taken after fasting for at least 8 hours.   BUN 20 8 - 23 mg/dL   Creatinine, Ser 1.22 0.61 - 1.24 mg/dL   Calcium 9.9 8.9 - 10.3 mg/dL   Total Protein 7.5 6.5 - 8.1 g/dL   Albumin 4.6 3.5 - 5.0 g/dL   AST 29 15 - 41 U/L   ALT 30 0 - 44 U/L   Alkaline Phosphatase 64 38 - 126 U/L   Total Bilirubin 3.1 (H) 0.3 - 1.2 mg/dL   GFR, Estimated >60 >60 mL/min    Comment: (NOTE) Calculated using the CKD-EPI Creatinine Equation (2021)    Anion gap 14 5 - 15    Comment: Performed at [facility], [place] [address], [place], [place] [address]  CBC     Status: Abnormal   Collection Time: 10/04/20 11:41 PM  Result Value Ref Range   WBC 12.4 (H) 4.0 - 10.5 K/uL   RBC 4.01 (L) 4.22 - 5.81 MIL/uL   Hemoglobin 13.6 13.0 - 17.0 g/dL   HCT 38.1 (L) 39 - 52 %    MCV 95.0 80.0 - 100.0 fL   MCH 33.9 26.0 - 34.0 pg   MCHC 35.7 30.0 - 36.0 g/dL   RDW 12.8 11.5 - 15.5 %   Platelets 271 150 - 400 K/uL   nRBC 0.0 0.0 - 0.2 %    Comment: Performed at [facility], [place] [address], [place], [place] [address]  Urinalysis, Routine w reflex microscopic     Status: Abnormal   Collection Time: 10/04/20 11:41 PM  Result Value Ref Range   Color, Urine YELLOW YELLOW   APPearance CLEAR CLEAR   Specific Gravity, Urine 1.026 1.005 - 1.030   pH 8.0 5.0 - 8.0  Glucose, UA NEGATIVE NEGATIVE mg/dL   Hgb urine dipstick NEGATIVE NEGATIVE   Bilirubin Urine NEGATIVE NEGATIVE   Ketones, ur 80 (A) NEGATIVE mg/dL   Protein, ur 30 (A) NEGATIVE mg/dL   Nitrite NEGATIVE NEGATIVE   Leukocytes,Ua NEGATIVE NEGATIVE   RBC / HPF 0-5 0 - 5 RBC/hpf   WBC, UA 0-5 0 - 5 WBC/hpf   Bacteria, UA RARE (A) NONE SEEN   Mucus PRESENT     Comment: Performed at [facility], [place] [address], [place], [place] [address]  Respiratory Panel by RT PCR (Flu A&B, Covid) - Nasopharyngeal Swab     Status: None   Collection Time: 10/05/20  2:38 AM   Specimen: Nasopharyngeal Swab  Result Value Ref Range   SARS Coronavirus 2 by RT PCR NEGATIVE NEGATIVE    Comment: (NOTE) SARS-CoV-2 target nucleic acids are NOT DETECTED.  The SARS-CoV-2 RNA is generally detectable in upper respiratoy specimens during the acute phase of infection. The lowest concentration of SARS-CoV-2 viral copies this assay can detect is 131 copies/mL. A negative result does not preclude SARS-Cov-2 infection and should not be used as the sole basis for treatment or other patient management decisions. A negative result may occur with  improper specimen collection/handling, submission of specimen other than nasopharyngeal swab, presence of viral mutation(s) within the areas targeted by this assay, and inadequate number of viral copies (<131 copies/mL). A negative result must be combined with  clinical observations, patient history, and epidemiological information. The expected result is Negative.  Fact Sheet for Patients:  PinkCheek.be  Fact Sheet for Healthcare Providers:  GravelBags.it  This test is no t yet approved or cleared by the [place] FDA and  has been authorized for detection and/or diagnosis of SARS-CoV-2 by FDA under an Emergency Use Authorization (EUA). This EUA will remain  in effect (meaning this test can be used) for the duration of the COVID-19 declaration under Section 564(b)(1) of the Act, 21 U.S.C. section 360bbb-3(b)(1), unless the authorization is terminated or revoked sooner.     Influenza A by PCR NEGATIVE NEGATIVE   Influenza B by PCR NEGATIVE NEGATIVE    Comment: (NOTE) The Xpert Xpress SARS-CoV-2/FLU/RSV assay is intended as an aid in  the diagnosis of influenza from Nasopharyngeal swab specimens and  should not be used as a sole basis for treatment. Nasal washings and  aspirates are unacceptable for Xpert Xpress SARS-CoV-2/FLU/RSV  testing.  Fact Sheet for Patients: PinkCheek.be  Fact Sheet for Healthcare Providers: GravelBags.it  This test is not yet approved or cleared by the [place] FDA and  has been authorized for detection and/or diagnosis of SARS-CoV-2 by  FDA under an Emergency Use Authorization (EUA). This EUA will remain  in effect (meaning this test can be used) for the duration of the  Covid-19 declaration under Section 564(b)(1) of the Act, 21  U.S.C. section 360bbb-3(b)(1), unless the authorization is  terminated or revoked. Performed at [facility], [place] [address], [place],  [address]    CT ABDOMEN PELVIS W CONTRAST  Result Date: 10/05/2020 CLINICAL DATA:  Left lower quadrant pain EXAM: CT ABDOMEN AND PELVIS WITH CONTRAST TECHNIQUE: Multidetector CT imaging of the  abdomen and pelvis was performed using the standard protocol following bolus administration of intravenous contrast. CONTRAST:  188mL OMNIPAQUE IOHEXOL 300 MG/ML  SOLN COMPARISON:  None. FINDINGS: LOWER CHEST: Normal. HEPATOBILIARY: Normal hepatic contours. No intra- or extrahepatic biliary dilatation. There is cholelithiasis without acute inflammation. PANCREAS: Normal pancreas.  No ductal dilatation or peripancreatic fluid collection. SPLEEN: Normal. ADRENALS/URINARY TRACT: The adrenal glands are normal. No hydronephrosis, nephroureterolithiasis or solid renal mass. The urinary bladder is normal for degree of distention STOMACH/BOWEL: There is no hiatal hernia. Normal duodenal course and caliber. Proximal small bowel is dilated. There is a relative transition point in the right lower quadrant (series image 61) where there is a focal area narrowing with fecalization proximal small contents. Sigmoid diverticulosis acute inflammation. Status post appendectomy. VASCULAR/LYMPHATIC: There is calcific atherosclerosis of the abdominal aorta. No lymphadenopathy. REPRODUCTIVE: Normal prostate size with symmetric seminal vesicles. MUSCULOSKELETAL. No bony spinal canal stenosis or focal osseous abnormality. OTHER: None. IMPRESSION: 1. Partial small-bowel obstruction with relative transition point in the right lower quadrant at a site of focal narrowing with fecalization of the more proximal small bowel contents. 2. Sigmoid diverticulosis without acute inflammation. Aortic Atherosclerosis (ICD10-I70.0). Electronically Signed   By: Ulyses Jarred M.D.   On: 10/05/2020 02:14      Assessment/Plan 74 yo male presenting with abdominal pain and diarrhea, with partial small bowel obstruction. CT scan reviewed - there is focal thickening of the mid/distal ileum with proximal small bowel dilation. Etiology of this focal enteritis is unclear; patient has no family or personal history of IBD. SMA and celiac vasculature appear widely  patent. Patient has some focal tenderness on exam but abdomen is very soft and nondistended. Recommend IV fluid hydration and supportive care, anticipate this will resolve with nonoperative management. - NPO, IV fluid hydration - Total bilirubin elevated at 3. No evidence of common bile duct dilation on imaging so biliary obstruction is unlikely. Trend LFTs. - No need for antibiotics - Surgery will continue to follow  Michaelle Birks, [facility], Hepatobiliary and Pancreatic Surgery 10/05/20 8:51 AM

## 2020-10-05 NOTE — ED Notes (Signed)
Patient transported to CT 

## 2020-10-06 ENCOUNTER — Inpatient Hospital Stay (HOSPITAL_COMMUNITY): Payer: Medicare HMO

## 2020-10-06 DIAGNOSIS — K56609 Unspecified intestinal obstruction, unspecified as to partial versus complete obstruction: Secondary | ICD-10-CM

## 2020-10-06 DIAGNOSIS — I10 Essential (primary) hypertension: Secondary | ICD-10-CM

## 2020-10-06 LAB — CBC
HCT: 38.1 % — ABNORMAL LOW (ref 39.0–52.0)
Hemoglobin: 13 g/dL (ref 13.0–17.0)
MCH: 33.3 pg (ref 26.0–34.0)
MCHC: 34.1 g/dL (ref 30.0–36.0)
MCV: 97.7 fL (ref 80.0–100.0)
Platelets: 231 10*3/uL (ref 150–400)
RBC: 3.9 MIL/uL — ABNORMAL LOW (ref 4.22–5.81)
RDW: 12.9 % (ref 11.5–15.5)
WBC: 7.6 10*3/uL (ref 4.0–10.5)
nRBC: 0 % (ref 0.0–0.2)

## 2020-10-06 LAB — COMPREHENSIVE METABOLIC PANEL
ALT: 21 U/L (ref 0–44)
AST: 20 U/L (ref 15–41)
Albumin: 3.9 g/dL (ref 3.5–5.0)
Alkaline Phosphatase: 61 U/L (ref 38–126)
Anion gap: 9 (ref 5–15)
BUN: 15 mg/dL (ref 8–23)
CO2: 28 mmol/L (ref 22–32)
Calcium: 9.2 mg/dL (ref 8.9–10.3)
Chloride: 100 mmol/L (ref 98–111)
Creatinine, Ser: 1.27 mg/dL — ABNORMAL HIGH (ref 0.61–1.24)
GFR, Estimated: >60 mL/min (ref >60)
Glucose, Bld: 122 mg/dL — ABNORMAL HIGH (ref 70–99)
Potassium: 4 mmol/L (ref 3.5–5.1)
Sodium: 137 mmol/L (ref 135–145)
Total Bilirubin: 2.7 mg/dL — ABNORMAL HIGH (ref 0.3–1.2)
Total Protein: 6.6 g/dL (ref 6.5–8.1)

## 2020-10-06 MED ORDER — PANTOPRAZOLE SODIUM 40 MG PO TBEC
40.0000 mg | DELAYED_RELEASE_TABLET | Freq: Every day | ORAL | Status: DC
  Administered 2020-10-06: 17:00:00 40 mg via ORAL
  Filled 2020-10-06: qty 1

## 2020-10-06 MED ORDER — DIATRIZOATE MEGLUMINE & SODIUM 66-10 % PO SOLN
90.0000 mL | Freq: Once | ORAL | Status: AC
  Administered 2020-10-06: 12:00:00 90 mL via ORAL
  Filled 2020-10-06: qty 90

## 2020-10-06 NOTE — Progress Notes (Signed)
PROGRESS NOTE  Jeffrey Wong:[id number] DOB: 21-Dec-1945 DOA: 10/04/2020 PCP: Shirline Frees, MD   LOS: 1 day   Brief Narrative / Interim history: 74 year old male with history of hypertension, diverticulitis, came to the hospital with abdominal pain with onset 10/30 in the afternoon, as well as nausea and vomiting.  Feels like the pain was similar to his prior episode of diverticulitis.  CT scan in the ED showed small bowel obstruction with transition point in the right lower quadrant, and no evidence of diverticulitis.  Subjective / 24h Interval events: Feeling better. Less abdominal pain today. Feels like he is about to pass gas but hasn't done so yet  Assessment & Plan: Principal Problem Small bowel obstruction with transition point-continue conservative management, patient not vomiting and NG tube has not been placed.  Hold for now.  General surgery consulted, appreciate input. IVF, NPO for now  Active Problems Essential hypertension-continue bisoprolol  GERD-on PPI  Scheduled Meds: . bisoprolol  2.5 mg Oral QHS  . bisoprolol  5 mg Oral Daily  . calcium carbonate  1 tablet Oral TID WC  . diatrizoate meglumine-sodium  90 mL Oral Once  . enoxaparin (LOVENOX) injection  40 mg Subcutaneous Q24H  . pantoprazole  40 mg Oral QPC supper   Continuous Infusions: . lactated ringers 100 mL/hr at 10/06/20 0551   PRN Meds:.alum & mag hydroxide-simeth, hydrALAZINE, morphine injection, ondansetron **OR** ondansetron (ZOFRAN) IV  Diet Orders (From admission, onward)    Start     Ordered   10/05/20 0319  Diet NPO time specified Except for: Sips with Meds  Diet effective now       Question:  Except for  Answer:  Sips with Meds   10/05/20 0318          DVT prophylaxis: enoxaparin (LOVENOX) injection 40 mg Start: 10/05/20 1000     Code Status: Full Code  Family Communication: no family present  Status is: Inpatient  Remains inpatient appropriate because:IV treatments  appropriate due to intensity of illness or inability to take PO   Dispo: The patient is from: Home              Anticipated d/c is to: Home              Anticipated d/c date is: 2 days              Patient currently is not medically stable to d/c.  Consultants:  General surgery  Procedures:  None   Microbiology  None   Antimicrobials: None     Objective: Vitals:   10/05/20 1630 10/05/20 1722 10/05/20 2149 10/06/20 0532  BP: (!) 168/81 (!) 168/77 (!) 166/82 (!) 152/82  Pulse: 60 (!) 58 (!) 59 (!) 58  Resp:   18 18  Temp:   98.3 F (36.8 C) 98.3 F (36.8 C)  TempSrc:   Oral Oral  SpO2: 97%  95% 97%  Weight:      Height:        Intake/Output Summary (Last 24 hours) at 10/06/2020 0950 Last data filed at 10/06/2020 0600 Gross per 24 hour  Intake 2365.28 ml  Output --  Net 2365.28 ml   Filed Weights   10/04/20 2122  Weight: 85.3 kg    Examination:  Constitutional: NAD Eyes: no icterus ENMT: mmm Neck: normal, supple Respiratory: CTA biL, no wheezing, no crackles Cardiovascular: RRR, no MRG. No edema Abdomen: soft, NT, ND, BS+ Musculoskeletal: no clubbing / cyanosis.  Skin: no rashes Neurologic:  non focal    Data Reviewed: I have independently reviewed following labs and imaging studies   CBC: Recent Labs  Lab 10/04/20 2341 10/05/20 1616 10/06/20 0424  WBC 12.4* 7.1 7.6  HGB 13.6 13.4 13.0  HCT 38.1* 39.1 38.1*  MCV 95.0 95.8 97.7  PLT 271 224 440   Basic Metabolic Panel: Recent Labs  Lab 10/04/20 2341 10/05/20 1616 10/06/20 0424  NA 134* 137 137  K 3.9 3.7 4.0  CL 99 101 100  CO2 21* 24 28  GLUCOSE 151* 128* 122*  BUN 20 15 15   CREATININE 1.22 1.14 1.27*  CALCIUM 9.9 9.5 9.2   Liver Function Tests: Recent Labs  Lab 10/04/20 2341 10/06/20 0424  AST 29 20  ALT 30 21  ALKPHOS 64 61  BILITOT 3.1* 2.7*  PROT 7.5 6.6  ALBUMIN 4.6 3.9   Coagulation Profile: No results for input(s): INR, PROTIME in the last 168 hours. HbA1C: No  results for input(s): HGBA1C in the last 72 hours. CBG: No results for input(s): GLUCAP in the last 168 hours.  Recent Results (from the past 240 hour(s))  Respiratory Panel by RT PCR (Flu A&B, Covid) - Nasopharyngeal Swab     Status: None   Collection Time: 10/05/20  2:38 AM   Specimen: Nasopharyngeal Swab  Result Value Ref Range Status   SARS Coronavirus 2 by RT PCR NEGATIVE NEGATIVE Final    Comment: (NOTE) SARS-CoV-2 target nucleic acids are NOT DETECTED.  The SARS-CoV-2 RNA is generally detectable in upper respiratoy specimens during the acute phase of infection. The lowest concentration of SARS-CoV-2 viral copies this assay can detect is 131 copies/mL. A negative result does not preclude SARS-Cov-2 infection and should not be used as the sole basis for treatment or other patient management decisions. A negative result may occur with  improper specimen collection/handling, submission of specimen other than nasopharyngeal swab, presence of viral mutation(s) within the areas targeted by this assay, and inadequate number of viral copies (<131 copies/mL). A negative result must be combined with clinical observations, patient history, and epidemiological information. The expected result is Negative.  Fact Sheet for Patients:  PinkCheek.be  Fact Sheet for Healthcare Providers:  GravelBags.it  This test is no t yet approved or cleared by the [place] FDA and  has been authorized for detection and/or diagnosis of SARS-CoV-2 by FDA under an Emergency Use Authorization (EUA). This EUA will remain  in effect (meaning this test can be used) for the duration of the COVID-19 declaration under Section 564(b)(1) of the Act, 21 U.S.C. section 360bbb-3(b)(1), unless the authorization is terminated or revoked sooner.     Influenza A by PCR NEGATIVE NEGATIVE Final   Influenza B by PCR NEGATIVE NEGATIVE Final    Comment:  (NOTE) The Xpert Xpress SARS-CoV-2/FLU/RSV assay is intended as an aid in  the diagnosis of influenza from Nasopharyngeal swab specimens and  should not be used as a sole basis for treatment. Nasal washings and  aspirates are unacceptable for Xpert Xpress SARS-CoV-2/FLU/RSV  testing.  Fact Sheet for Patients: PinkCheek.be  Fact Sheet for Healthcare Providers: GravelBags.it  This test is not yet approved or cleared by the [place] FDA and  has been authorized for detection and/or diagnosis of SARS-CoV-2 by  FDA under an Emergency Use Authorization (EUA). This EUA will remain  in effect (meaning this test can be used) for the duration of the  Covid-19 declaration under Section 564(b)(1) of the Act, 21  U.S.C. section 360bbb-3(b)(1), unless  the authorization is  terminated or revoked. Performed at [facility], [place] [address] [place], [place] [address]      Radiology Studies: DG Abd Portable 1V  Result Date: 10/06/2020 CLINICAL DATA:  Small bowel obstruction. EXAM: PORTABLE ABDOMEN - 1 VIEW COMPARISON:  October 05, 2020. FINDINGS: Stable small bowel dilatation is noted concerning for distal small bowel obstruction. No colonic dilatation is noted. No radio-opaque calculi or other significant radiographic abnormality are seen. IMPRESSION: Stable small bowel dilatation is noted concerning for distal small bowel obstruction. Electronically Signed   By: Marijo Conception M.D.   On: 10/06/2020 08:09   DG Abd Portable 2V  Result Date: 10/05/2020 CLINICAL DATA:  Abdominal pain. EXAM: PORTABLE ABDOMEN - 2 VIEW COMPARISON:  CT of the abdomen pelvis October 06, 2019 FINDINGS: Gaseous distension of stomach and small bowel loops in the mid abdomen. Maximum diameter of the dilated small bowel loops is 4.4 cm. There is residual gas within the colon. IMPRESSION: Ongoing early or incomplete small bowel obstruction.  Electronically Signed   By: Fidela Salisbury M.D.   On: 10/05/2020 17:10   Marzetta Board, MD, PhD [facility]  Between 7 am - 7 pm I am available, please contact me via Amion or Securechat  Between 7 pm - 7 am I am not available, please contact night coverage MD/APP via Amion

## 2020-10-06 NOTE — Progress Notes (Addendum)
Partial small bowel obstruction (HCC)  Subjective: Pt feels much better, passing flatus but no BM yet.  AXR looks about the same  Objective: Vital signs in last 24 hours: Temp:  [98.1 F (36.7 C)-98.3 F (36.8 C)] 98.3 F (36.8 C) (11/01 0532) Pulse Rate:  [58-63] 58 (11/01 0532) Resp:  [18] 18 (11/01 0532) BP: (152-189)/(76-83) 152/82 (11/01 0532) SpO2:  [95 %-99 %] 97 % (11/01 0532) Last BM Date: 09/03/20  Intake/Output from previous day: 10/31 0701 - 11/01 0700 In: 2365.3 [I.V.:2365.3] Out: -  Intake/Output this shift: No intake/output data recorded.  General appearance: alert and cooperative GI: soft, distended  Lab Results:  Results for orders placed or performed during the hospital encounter of 10/04/20 (from the past 24 hour(s))  CBC     Status: Abnormal   Collection Time: 10/05/20  4:16 PM  Result Value Ref Range   WBC 7.1 4.0 - 10.5 K/uL   RBC 4.08 (L) 4.22 - 5.81 MIL/uL   Hemoglobin 13.4 13.0 - 17.0 g/dL   HCT 39.1 39 - 52 %   MCV 95.8 80.0 - 100.0 fL   MCH 32.8 26.0 - 34.0 pg   MCHC 34.3 30.0 - 36.0 g/dL   RDW 12.9 11.5 - 15.5 %   Platelets 224 150 - 400 K/uL   nRBC 0.0 0.0 - 0.2 %  Basic metabolic panel     Status: Abnormal   Collection Time: 10/05/20  4:16 PM  Result Value Ref Range   Sodium 137 135 - 145 mmol/L   Potassium 3.7 3.5 - 5.1 mmol/L   Chloride 101 98 - 111 mmol/L   CO2 24 22 - 32 mmol/L   Glucose, Bld 128 (H) 70 - 99 mg/dL   BUN 15 8 - 23 mg/dL   Creatinine, Ser 1.14 0.61 - 1.24 mg/dL   Calcium 9.5 8.9 - 10.3 mg/dL   GFR, Estimated >60 >60 mL/min   Anion gap 12 5 - 15  Lactic acid, plasma     Status: None   Collection Time: 10/05/20  4:16 PM  Result Value Ref Range   Lactic Acid, Venous 1.3 0.5 - 1.9 mmol/L  CBC     Status: Abnormal   Collection Time: 10/06/20  4:24 AM  Result Value Ref Range   WBC 7.6 4.0 - 10.5 K/uL   RBC 3.90 (L) 4.22 - 5.81 MIL/uL   Hemoglobin 13.0 13.0 - 17.0 g/dL   HCT 38.1 (L) 39 - 52 %   MCV 97.7 80.0  - 100.0 fL   MCH 33.3 26.0 - 34.0 pg   MCHC 34.1 30.0 - 36.0 g/dL   RDW 12.9 11.5 - 15.5 %   Platelets 231 150 - 400 K/uL   nRBC 0.0 0.0 - 0.2 %  Comprehensive metabolic panel     Status: Abnormal   Collection Time: 10/06/20  4:24 AM  Result Value Ref Range   Sodium 137 135 - 145 mmol/L   Potassium 4.0 3.5 - 5.1 mmol/L   Chloride 100 98 - 111 mmol/L   CO2 28 22 - 32 mmol/L   Glucose, Bld 122 (H) 70 - 99 mg/dL   BUN 15 8 - 23 mg/dL   Creatinine, Ser 1.27 (H) 0.61 - 1.24 mg/dL   Calcium 9.2 8.9 - 10.3 mg/dL   Total Protein 6.6 6.5 - 8.1 g/dL   Albumin 3.9 3.5 - 5.0 g/dL   AST 20 15 - 41 U/L   ALT 21 0 - 44 U/L  Alkaline Phosphatase 61 38 - 126 U/L   Total Bilirubin 2.7 (H) 0.3 - 1.2 mg/dL   GFR, Estimated >60 >60 mL/min   Anion gap 9 5 - 15     Studies/Results Radiology     MEDS, Scheduled  bisoprolol  2.5 mg Oral QHS   bisoprolol  5 mg Oral Daily   calcium carbonate  1 tablet Oral TID WC   diatrizoate meglumine-sodium  90 mL Oral Once   enoxaparin (LOVENOX) injection  40 mg Subcutaneous Q24H   pantoprazole  40 mg Oral QPC supper     Assessment: Partial small bowel obstruction (HCC)   Plan: Clinically improving.  Cont NPO until return of BM.  If no improvement by tom am, will need NG Hyperbilirubinemia: will need to follow this and will most likely need GI w/u at some point in the future   LOS: 1 day    Rosario Adie, MD [facility], [place]    10/06/2020 9:30 AM

## 2020-10-07 ENCOUNTER — Other Ambulatory Visit (HOSPITAL_COMMUNITY): Payer: Medicare HMO

## 2020-10-07 ENCOUNTER — Inpatient Hospital Stay (HOSPITAL_COMMUNITY): Payer: Medicare HMO

## 2020-10-07 LAB — COMPREHENSIVE METABOLIC PANEL
ALT: 21 U/L (ref 0–44)
AST: 22 U/L (ref 15–41)
Albumin: 4.3 g/dL (ref 3.5–5.0)
Alkaline Phosphatase: 66 U/L (ref 38–126)
Anion gap: 7 (ref 5–15)
BUN: 12 mg/dL (ref 8–23)
CO2: 27 mmol/L (ref 22–32)
Calcium: 9 mg/dL (ref 8.9–10.3)
Chloride: 101 mmol/L (ref 98–111)
Creatinine, Ser: 1.28 mg/dL — ABNORMAL HIGH (ref 0.61–1.24)
GFR, Estimated: 59 mL/min — ABNORMAL LOW (ref >60)
Glucose, Bld: 109 mg/dL — ABNORMAL HIGH (ref 70–99)
Potassium: 3.5 mmol/L (ref 3.5–5.1)
Sodium: 135 mmol/L (ref 135–145)
Total Bilirubin: 2.4 mg/dL — ABNORMAL HIGH (ref 0.3–1.2)
Total Protein: 6.8 g/dL (ref 6.5–8.1)

## 2020-10-07 LAB — CBC
HCT: 37.9 % — ABNORMAL LOW (ref 39.0–52.0)
Hemoglobin: 13 g/dL (ref 13.0–17.0)
MCH: 33.7 pg (ref 26.0–34.0)
MCHC: 34.3 g/dL (ref 30.0–36.0)
MCV: 98.2 fL (ref 80.0–100.0)
Platelets: 250 10*3/uL (ref 150–400)
RBC: 3.86 MIL/uL — ABNORMAL LOW (ref 4.22–5.81)
RDW: 12.7 % (ref 11.5–15.5)
WBC: 8.3 10*3/uL (ref 4.0–10.5)
nRBC: 0 % (ref 0.0–0.2)

## 2020-10-07 MED ORDER — OXYCODONE HCL 5 MG PO TABS: 5.0000 mg | ORAL_TABLET | ORAL | Status: DC | PRN

## 2020-10-07 MED ORDER — ACETAMINOPHEN 500 MG PO TABS: 1000.0000 mg | ORAL_TABLET | Freq: Three times a day (TID) | ORAL | Status: DC | PRN

## 2020-10-07 MED ORDER — MORPHINE SULFATE (PF) 2 MG/ML IV SOLN: 2.0000 mg | INTRAVENOUS | Status: DC | PRN

## 2020-10-07 NOTE — Progress Notes (Addendum)
Subjective: CC: Doing well. Abdominal pain and distension has resolved. Tolerating FLD this AM. Passing flatus. Liquid BM's yesterday.   Objective: Vital signs in last 24 hours: Temp:  [98 F (36.7 C)-98.2 F (36.8 C)] 98 F (36.7 C) (11/02 0555) Pulse Rate:  [53-56] 56 (11/02 0555) Resp:  [18] 18 (11/02 0555) BP: (152-160)/(74-80) 160/80 (11/02 0555) SpO2:  [96 %-100 %] 96 % (11/02 0555) Last BM Date: 10/06/20  Intake/Output from previous day: 11/01 0701 - 11/02 0700 In: 2779.5 [P.O.:470; I.V.:2309.5] Out: -  Intake/Output this shift: No intake/output data recorded.  PE: Gen:  Alert, NAD, pleasant Pulm:  Normal rate and effort Abd: Soft, NT/ND, +BS Ext:  No LE edema  Psych: A&Ox3  Skin: no rashes noted, warm and dry  Lab Results:  Recent Labs    10/06/20 0424 10/07/20 0437  WBC 7.6 8.3  HGB 13.0 13.0  HCT 38.1* 37.9*  PLT 231 250   BMET Recent Labs    10/06/20 0424 10/07/20 0437  NA 137 135  K 4.0 3.5  CL 100 101  CO2 28 27  GLUCOSE 122* 109*  BUN 15 12  CREATININE 1.27* 1.28*  CALCIUM 9.2 9.0   PT/INR No results for input(s): LABPROT, INR in the last 72 hours. CMP     Component Value Date/Time   NA 135 10/07/2020 0437   K 3.5 10/07/2020 0437   CL 101 10/07/2020 0437   CO2 27 10/07/2020 0437   GLUCOSE 109 (H) 10/07/2020 0437   BUN 12 10/07/2020 0437   CREATININE 1.28 (H) 10/07/2020 0437   CALCIUM 9.0 10/07/2020 0437   PROT 6.8 10/07/2020 0437   ALBUMIN 4.3 10/07/2020 0437   AST 22 10/07/2020 0437   ALT 21 10/07/2020 0437   ALKPHOS 66 10/07/2020 0437   BILITOT 2.4 (H) 10/07/2020 0437   GFRNONAA 59 (L) 10/07/2020 0437   Lipase     Component Value Date/Time   LIPASE 29 10/04/2020 2341       Studies/Results: DG Abd Portable 1V-Small Bowel Obstruction Protocol-initial, 8 hr delay  Result Date: 10/06/2020 CLINICAL DATA:  Small-bowel obstruction EXAM: PORTABLE ABDOMEN - 1 VIEW COMPARISON:  7:45 a.m. FINDINGS: Administered  oral contrast is seen at lining decompressed colon and extends into the rectal vault. Multiple gas-filled dilated loops of small bowel are again seen within the mid abdomen, however, the degree of bowel dilatation and number of dilated loops has both improved since prior examination. Together, the findings are compatible with a resolving partial small bowel obstruction. No gross free intraperitoneal gas. Cholelithiasis. Osseous structures are unremarkable. IMPRESSION: Resolving partial small bowel obstruction. Electronically Signed   By: Fidela Salisbury MD   On: 10/06/2020 20:18   DG Abd Portable 1V  Result Date: 10/06/2020 CLINICAL DATA:  Small bowel obstruction. EXAM: PORTABLE ABDOMEN - 1 VIEW COMPARISON:  October 05, 2020. FINDINGS: Stable small bowel dilatation is noted concerning for distal small bowel obstruction. No colonic dilatation is noted. No radio-opaque calculi or other significant radiographic abnormality are seen. IMPRESSION: Stable small bowel dilatation is noted concerning for distal small bowel obstruction. Electronically Signed   By: Marijo Conception M.D.   On: 10/06/2020 08:09   DG Abd Portable 2V  Result Date: 10/05/2020 CLINICAL DATA:  Abdominal pain. EXAM: PORTABLE ABDOMEN - 2 VIEW COMPARISON:  CT of the abdomen pelvis October 06, 2019 FINDINGS: Gaseous distension of stomach and small bowel loops in the mid abdomen. Maximum diameter of the dilated small  bowel loops is 4.4 cm. There is residual gas within the colon. IMPRESSION: Ongoing early or incomplete small bowel obstruction. Electronically Signed   By: Fidela Salisbury M.D.   On: 10/05/2020 17:10    Anti-infectives: Anti-infectives (From admission, onward)   None       Assessment/Plan HTN Elevated T bili - noted to be 3.1 on admission. Down trending. Remaining LFT's wnl. WBC wnl. No CBD dilation on imaging. No GB inflammation noted on CT. Will need follow up with PCP (Dr. Kenton Kingfisher) for repeat labs to ensure it is  downtrending. Will communicate this with primary team.   SBO - Clinically and radiographically resolving. Will adv to soft diet this am. If tolerates diet, can be d/c'd later today from our standpoint. I have reached out to patients primary team to let them know this. We will follow with you  FEN - Soft VTE - SCDs, Lovenox  ID - None    LOS: 2 days    Jillyn Ledger , [facility] Surgery 10/07/2020, 9:00 AM Please see Amion for pager number during day hours 7:00am-4:30pm

## 2020-10-07 NOTE — Discharge Summary (Signed)
Physician Discharge Summary  Jeffrey Wong FWY:[id number] DOB: 10/16/1946 DOA: 10/04/2020  PCP: Shirline Frees, MD  Admit date: 10/04/2020 Discharge date: 10/07/2020  Admitted From: home Disposition:  home  Recommendations for Outpatient Follow-up:  1. Follow up with PCP in 1-2 weeks 2. Please obtain CMP/CBC in one week  Home Health: none  Equipment/Devices: none   Discharge Condition: stable CODE STATUS: Full code Diet recommendation: regular  HPI: Per admitting MD,  Jeffrey Wong is a 74 y.o. male with medical history significant of HTN, diverticulitis. Pt presents to ED with c/o abd pain.  Onset yesterday morning.  Has become severe, 10/10, radiates to back occasionally.  Has had associated N/V.  1 episode of diarrhea.  No blood in stool nor emesis. No fevers, chills. Pain similar to diverticulitis about 5 years ago.  Hospital Course / Discharge diagnoses: Principal Problem Small bowel obstruction with transition point-patient was admitted to the hospital and general surgery was consulted.  He was managed conservatively with n.p.o. status, IV fluids.  Serial abdominal x-rays showed improvement in his obstruction, clinically his SBO has resolved, he is having regular bowel movements and tolerating a regular diet.  He will be discharged home in stable condition.  Active Problems Essential hypertension-continue bisoprolol  Discharge Instructions   Allergies as of 10/07/2020   No Known Allergies     Medication List    STOP taking these medications   bisoprolol-hydrochlorothiazide 2.5-6.25 MG tablet Commonly known as: ZIAC   famotidine 20 MG tablet Commonly known as: Pepcid     TAKE these medications   albuterol 108 (90 Base) MCG/ACT inhaler Commonly known as: VENTOLIN HFA Inhale into the lungs.   aspirin 81 MG chewable tablet Chew by mouth daily.   bisoprolol 5 MG tablet Commonly known as: ZEBETA Take 5 mg by mouth See admin instructions. Takes 1  whole tablet in the morning and 1/2 tablet at night   cholecalciferol 25 MCG (1000 UNIT) tablet Commonly known as: VITAMIN D3 Take 1,000 Units by mouth daily.   GARLIC PO Take 1 tablet by mouth daily.   JUICE PLUS FIBRE PO Take by mouth.   pantoprazole 40 MG tablet Commonly known as: Protonix Take 1 tablet (40 mg total) by mouth daily. Take 30-60 min before first meal of the day   QC TUMERIC COMPLEX PO Take 1 tablet by mouth daily.   traMADol 50 MG tablet Commonly known as: Ultram 1-2 every 4 hours as needed for cough or pain   vitamin B-12 1000 MCG tablet Commonly known as: CYANOCOBALAMIN Take 1,000 mcg by mouth daily.       Follow-up Information    Shirline Frees, MD. Schedule an appointment as soon as possible for a visit in 1 week(s).   Specialty: Family Medicine Why: For repeat liver function tests.  Contact information: [place] [address] [phone]               Consultations:  General surgery   Procedures/Studies: None   DG Chest 2 View  Result Date: 09/10/2020 CLINICAL DATA:  Chronic short of breath EXAM: CHEST - 2 VIEW COMPARISON:  02/10/2016 FINDINGS: Normal mediastinum and cardiac silhouette. Lungs are hyperinflated. Normal pulmonary vasculature. No evidence of effusion, infiltrate, or pneumothorax. No acute bony abnormality. IMPRESSION: Hyperinflated lungs.  No acute findings. Electronically Signed   By: Suzy Bouchard M.D.   On: 09/10/2020 15:58   CT ABDOMEN PELVIS W CONTRAST  Result Date: 10/05/2020 CLINICAL DATA:  Left lower quadrant pain EXAM: CT ABDOMEN AND PELVIS WITH CONTRAST TECHNIQUE: Multidetector CT imaging of the abdomen and pelvis was performed using the standard protocol following bolus administration of intravenous contrast. CONTRAST:  165mL OMNIPAQUE IOHEXOL 300 MG/ML  SOLN COMPARISON:  None. FINDINGS: LOWER CHEST: Normal. HEPATOBILIARY: Normal hepatic contours. No intra- or extrahepatic  biliary dilatation. There is cholelithiasis without acute inflammation. PANCREAS: Normal pancreas. No ductal dilatation or peripancreatic fluid collection. SPLEEN: Normal. ADRENALS/URINARY TRACT: The adrenal glands are normal. No hydronephrosis, nephroureterolithiasis or solid renal mass. The urinary bladder is normal for degree of distention STOMACH/BOWEL: There is no hiatal hernia. Normal duodenal course and caliber. Proximal small bowel is dilated. There is a relative transition point in the right lower quadrant (series image 61) where there is a focal area narrowing with fecalization proximal small contents. Sigmoid diverticulosis acute inflammation. Status post appendectomy. VASCULAR/LYMPHATIC: There is calcific atherosclerosis of the abdominal aorta. No lymphadenopathy. REPRODUCTIVE: Normal prostate size with symmetric seminal vesicles. MUSCULOSKELETAL. No bony spinal canal stenosis or focal osseous abnormality. OTHER: None. IMPRESSION: 1. Partial small-bowel obstruction with relative transition point in the right lower quadrant at a site of focal narrowing with fecalization of the more proximal small bowel contents. 2. Sigmoid diverticulosis without acute inflammation. Aortic Atherosclerosis (ICD10-I70.0). Electronically Signed   By: Ulyses Jarred M.D.   On: 10/05/2020 02:14   DG Abd Portable 1V-Small Bowel Obstruction Protocol-initial, 8 hr delay  Result Date: 10/06/2020 CLINICAL DATA:  Small-bowel obstruction EXAM: PORTABLE ABDOMEN - 1 VIEW COMPARISON:  7:45 a.m. FINDINGS: Administered oral contrast is seen at lining decompressed colon and extends into the rectal vault. Multiple gas-filled dilated loops of small bowel are again seen within the mid abdomen, however, the degree of bowel dilatation and number of dilated loops has both improved since prior examination. Together, the findings are compatible with a resolving partial small bowel obstruction. No gross free intraperitoneal gas. Cholelithiasis.  Osseous structures are unremarkable. IMPRESSION: Resolving partial small bowel obstruction. Electronically Signed   By: Fidela Salisbury MD   On: 10/06/2020 20:18   DG Abd Portable 1V  Result Date: 10/06/2020 CLINICAL DATA:  Small bowel obstruction. EXAM: PORTABLE ABDOMEN - 1 VIEW COMPARISON:  October 05, 2020. FINDINGS: Stable small bowel dilatation is noted concerning for distal small bowel obstruction. No colonic dilatation is noted. No radio-opaque calculi or other significant radiographic abnormality are seen. IMPRESSION: Stable small bowel dilatation is noted concerning for distal small bowel obstruction. Electronically Signed   By: Marijo Conception M.D.   On: 10/06/2020 08:09   DG Abd Portable 2V  Result Date: 10/05/2020 CLINICAL DATA:  Abdominal pain. EXAM: PORTABLE ABDOMEN - 2 VIEW COMPARISON:  CT of the abdomen pelvis October 06, 2019 FINDINGS: Gaseous distension of stomach and small bowel loops in the mid abdomen. Maximum diameter of the dilated small bowel loops is 4.4 cm. There is residual gas within the colon. IMPRESSION: Ongoing early or incomplete small bowel obstruction. Electronically Signed   By: Fidela Salisbury M.D.   On: 10/05/2020 17:10      Subjective: - no chest pain, shortness of breath, no abdominal pain, nausea or vomiting.   Discharge Exam: BP (!) 160/80 (BP Location: Right Arm)   Pulse (!) 56   Temp 98 F (36.7 C) (Oral)   Resp 18   Ht 5\' 10"  (1.778 m)   Wt 85.3 kg   SpO2 96%   BMI 26.98 kg/m   General: Pt is alert, awake, not in acute distress Cardiovascular:  RRR, S1/S2 +, no rubs, no gallops Respiratory: CTA bilaterally, no wheezing, no rhonchi Abdominal: Soft, NT, ND, bowel sounds + Extremities: no edema, no cyanosis    The results of significant diagnostics from this hospitalization (including imaging, microbiology, ancillary and laboratory) are listed below for reference.     Microbiology: Recent Results (from the past 240 hour(s))    Respiratory Panel by RT PCR (Flu A&B, Covid) - Nasopharyngeal Swab     Status: None   Collection Time: 10/05/20  2:38 AM   Specimen: Nasopharyngeal Swab  Result Value Ref Range Status   SARS Coronavirus 2 by RT PCR NEGATIVE NEGATIVE Final    Comment: (NOTE) SARS-CoV-2 target nucleic acids are NOT DETECTED.  The SARS-CoV-2 RNA is generally detectable in upper respiratoy specimens during the acute phase of infection. The lowest concentration of SARS-CoV-2 viral copies this assay can detect is 131 copies/mL. A negative result does not preclude SARS-Cov-2 infection and should not be used as the sole basis for treatment or other patient management decisions. A negative result may occur with  improper specimen collection/handling, submission of specimen other than nasopharyngeal swab, presence of viral mutation(s) within the areas targeted by this assay, and inadequate number of viral copies (<131 copies/mL). A negative result must be combined with clinical observations, patient history, and epidemiological information. The expected result is Negative.  Fact Sheet for Patients:  PinkCheek.be  Fact Sheet for Healthcare Providers:  GravelBags.it  This test is no t yet approved or cleared by the [place] FDA and  has been authorized for detection and/or diagnosis of SARS-CoV-2 by FDA under an Emergency Use Authorization (EUA). This EUA will remain  in effect (meaning this test can be used) for the duration of the COVID-19 declaration under Section 564(b)(1) of the Act, 21 U.S.C. section 360bbb-3(b)(1), unless the authorization is terminated or revoked sooner.     Influenza A by PCR NEGATIVE NEGATIVE Final   Influenza B by PCR NEGATIVE NEGATIVE Final    Comment: (NOTE) The Xpert Xpress SARS-CoV-2/FLU/RSV assay is intended as an aid in  the diagnosis of influenza from Nasopharyngeal swab specimens and  should not be used as  a sole basis for treatment. Nasal washings and  aspirates are unacceptable for Xpert Xpress SARS-CoV-2/FLU/RSV  testing.  Fact Sheet for Patients: PinkCheek.be  Fact Sheet for Healthcare Providers: GravelBags.it  This test is not yet approved or cleared by the [place] FDA and  has been authorized for detection and/or diagnosis of SARS-CoV-2 by  FDA under an Emergency Use Authorization (EUA). This EUA will remain  in effect (meaning this test can be used) for the duration of the  Covid-19 declaration under Section 564(b)(1) of the Act, 21  U.S.C. section 360bbb-3(b)(1), unless the authorization is  terminated or revoked. Performed at [facility], [place] [address] [place], [place] [address]      Labs: Basic Metabolic Panel: Recent Labs  Lab 10/04/20 2341 10/05/20 1616 10/06/20 0424 10/07/20 0437  NA 134* 137 137 135  K 3.9 3.7 4.0 3.5  CL 99 101 100 101  CO2 21* 24 28 27   GLUCOSE 151* 128* 122* 109*  BUN 20 15 15 12   CREATININE 1.22 1.14 1.27* 1.28*  CALCIUM 9.9 9.5 9.2 9.0   Liver Function Tests: Recent Labs  Lab 10/04/20 2341 10/06/20 0424 10/07/20 0437  AST 29 20 22   ALT 30 21 21   ALKPHOS 64 61 66  BILITOT 3.1* 2.7* 2.4*  PROT 7.5 6.6 6.8  ALBUMIN 4.6 3.9 4.3   CBC: Recent Labs  Lab 10/04/20 2341 10/05/20 1616 10/06/20 0424 10/07/20 0437  WBC 12.4* 7.1 7.6 8.3  HGB 13.6 13.4 13.0 13.0  HCT 38.1* 39.1 38.1* 37.9*  MCV 95.0 95.8 97.7 98.2  PLT 271 224 231 250   CBG: No results for input(s): GLUCAP in the last 168 hours. Hgb A1c No results for input(s): HGBA1C in the last 72 hours. Lipid Profile No results for input(s): CHOL, HDL, LDLCALC, TRIG, CHOLHDL, LDLDIRECT in the last 72 hours. Thyroid function studies No results for input(s): TSH, T4TOTAL, T3FREE, THYROIDAB in the last 72 hours.  Invalid input(s): FREET3 Urinalysis    Component Value Date/Time    COLORURINE YELLOW 10/04/2020 [place] 10/04/2020 2341   LABSPEC 1.026 10/04/2020 2341   PHURINE 8.0 10/04/2020 2341   [place] 10/04/2020 2341   HGBUR NEGATIVE 10/04/2020 2341   BILIRUBINUR NEGATIVE 10/04/2020 2341   KETONESUR 80 (A) 10/04/2020 2341   PROTEINUR 30 (A) 10/04/2020 2341   NITRITE NEGATIVE 10/04/2020 2341   LEUKOCYTESUR NEGATIVE 10/04/2020 2341    FURTHER DISCHARGE INSTRUCTIONS:   Get Medicines reviewed and adjusted: Please take all your medications with you for your next visit with your Primary MD   Laboratory/radiological data: Please request your Primary MD to go over all hospital tests and procedure/radiological results at the follow up, please ask your Primary MD to get all Hospital records sent to his/her office.   In some cases, they will be blood work, cultures and biopsy results pending at the time of your discharge. Please request that your primary care M.D. goes through all the records of your hospital data and follows up on these results.   Also Note the following: If you experience worsening of your admission symptoms, develop shortness of breath, life threatening emergency, suicidal or homicidal thoughts you must seek medical attention immediately by calling 911 or calling your MD immediately  if symptoms less severe.   You must read complete instructions/literature along with all the possible adverse reactions/side effects for all the Medicines you take and that have been prescribed to you. Take any new Medicines after you have completely understood and accpet all the possible adverse reactions/side effects.    Do not drive when taking Pain medications or sleeping medications (Benzodaizepines)   Do not take more than prescribed Pain, Sleep and Anxiety Medications. It is not advisable to combine anxiety,sleep and pain medications without talking with your primary care practitioner   Special Instructions: If you have smoked or chewed  Tobacco  in the last 2 yrs please stop smoking, stop any regular Alcohol  and or any Recreational drug use.   Wear Seat belts while driving.   Please note: You were cared for by a hospitalist during your hospital stay. Once you are discharged, your primary care physician will handle any further medical issues. Please note that NO REFILLS for any discharge medications will be authorized once you are discharged, as it is imperative that you return to your primary care physician (or establish a relationship with a primary care physician if you do not have one) for your post hospital discharge needs so that they can reassess your need for medications and monitor your lab values.  Time coordinating discharge: 35 minutes  SIGNED:  Marzetta Board, MD, PhD 10/07/2020, 12:07 PM

## 2020-10-07 NOTE — Progress Notes (Signed)
Discharge instructions discussed with patient, verbalized agreement and understanding 

## 2020-10-15 DIAGNOSIS — K219 Gastro-esophageal reflux disease without esophagitis: Secondary | ICD-10-CM | POA: Diagnosis not present

## 2020-10-15 DIAGNOSIS — K56600 Partial intestinal obstruction, unspecified as to cause: Secondary | ICD-10-CM | POA: Diagnosis not present

## 2020-10-15 DIAGNOSIS — I10 Essential (primary) hypertension: Secondary | ICD-10-CM | POA: Diagnosis not present

## 2020-12-26 DIAGNOSIS — J069 Acute upper respiratory infection, unspecified: Secondary | ICD-10-CM | POA: Diagnosis not present

## 2020-12-31 DIAGNOSIS — U071 COVID-19: Secondary | ICD-10-CM | POA: Diagnosis not present

## 2021-01-28 DIAGNOSIS — S61011A Laceration without foreign body of right thumb without damage to nail, initial encounter: Secondary | ICD-10-CM | POA: Diagnosis not present

## 2021-01-28 DIAGNOSIS — W540XXA Bitten by dog, initial encounter: Secondary | ICD-10-CM | POA: Diagnosis not present

## 2021-02-17 ENCOUNTER — Encounter: Payer: Self-pay | Admitting: Physician Assistant

## 2021-02-17 ENCOUNTER — Ambulatory Visit: Payer: Medicare HMO | Admitting: Physician Assistant

## 2021-02-17 ENCOUNTER — Other Ambulatory Visit: Payer: Self-pay

## 2021-02-17 DIAGNOSIS — L820 Inflamed seborrheic keratosis: Secondary | ICD-10-CM | POA: Diagnosis not present

## 2021-02-17 DIAGNOSIS — Z1283 Encounter for screening for malignant neoplasm of skin: Secondary | ICD-10-CM

## 2021-02-17 DIAGNOSIS — B078 Other viral warts: Secondary | ICD-10-CM | POA: Diagnosis not present

## 2021-02-17 DIAGNOSIS — L570 Actinic keratosis: Secondary | ICD-10-CM

## 2021-02-17 DIAGNOSIS — Z86018 Personal history of other benign neoplasm: Secondary | ICD-10-CM

## 2021-03-02 ENCOUNTER — Encounter: Payer: Self-pay | Admitting: Physician Assistant

## 2021-03-02 NOTE — Progress Notes (Signed)
   Follow-Up Visit   Subjective  Jeffrey Wong is a 75 y.o. male who presents for the following: Follow-up (Patient here today for 6 month follow up no new concerns.).   The following portions of the chart were reviewed this encounter and updated as appropriate:  Tobacco  Allergies  Meds  Problems  Med Hx  Surg Hx  Fam Hx      Objective  Well appearing patient in no apparent distress; mood and affect are within normal limits.  A full examination was performed including scalp, head, eyes, ears, nose, lips, neck, chest, axillae, abdomen, back, buttocks, bilateral upper extremities, bilateral lower extremities, hands, feet, fingers, toes, fingernails, and toenails. All findings within normal limits unless otherwise noted below.  Objective  head to toe: No atypical nevi No signs of non-mole skin cancer.   Objective  Right Post Shoulder: White scar-clear  Objective  Left Upper Back: Erythematous patches with gritty scale.  Objective  Left Buccal Cheek (9), Left Forearm - Posterior (10), Right Breast (10), Right Buccal Cheek, Right Forearm - Posterior (10), Right Lower Back (10): Erythematous stuck-on, waxy papule or plaque.   Objective  Right Dorsal Mid 2nd Finger: Verrucous papules -- Discussed viral etiology and contagion.    Assessment & Plan  Encounter for screening for malignant neoplasm of skin head to toe  Yearly skin check  History of dysplastic nevus Right Post Shoulder  Yearly skin check  AK (actinic keratosis) Left Upper Back  Destruction of lesion - Left Upper Back Complexity: simple   Destruction method: cryotherapy   Informed consent: discussed and consent obtained   Timeout:  patient name, date of birth, surgical site, and procedure verified Lesion destroyed using liquid nitrogen: Yes   Cryotherapy cycles:  3 Outcome: patient tolerated procedure well with no complications    Seborrheic keratosis, inflamed (50) Left Forearm - Posterior  (10); Right Forearm - Posterior (10); Right Breast (10); Right Lower Back (10); Left Buccal Cheek (9); Right Buccal Cheek  Destruction of lesion - Left Buccal Cheek, Left Forearm - Posterior, Right Breast, Right Buccal Cheek, Right Forearm - Posterior, Right Lower Back Complexity: simple   Destruction method: cryotherapy   Informed consent: discussed and consent obtained   Timeout:  patient name, date of birth, surgical site, and procedure verified Lesion destroyed using liquid nitrogen: Yes   Cryotherapy cycles:  3 Outcome: patient tolerated procedure well with no complications    Other viral warts Right Dorsal Mid 2nd Finger  Destruction of lesion - Right Dorsal Mid 2nd Finger Complexity: simple   Destruction method: cryotherapy   Informed consent: discussed and consent obtained   Timeout:  patient name, date of birth, surgical site, and procedure verified Lesion destroyed using liquid nitrogen: Yes   Cryotherapy cycles:  3 Outcome: patient tolerated procedure well with no complications      I, Denorris Reust, PA-C, have reviewed all documentation's for this visit.  The documentation on 03/02/21 for the exam, diagnosis, procedures and orders are all accurate and complete.

## 2021-03-09 DIAGNOSIS — K219 Gastro-esophageal reflux disease without esophagitis: Secondary | ICD-10-CM | POA: Diagnosis not present

## 2021-03-09 DIAGNOSIS — I10 Essential (primary) hypertension: Secondary | ICD-10-CM | POA: Diagnosis not present

## 2021-03-09 DIAGNOSIS — E782 Mixed hyperlipidemia: Secondary | ICD-10-CM | POA: Diagnosis not present

## 2021-03-09 DIAGNOSIS — N183 Chronic kidney disease, stage 3 unspecified: Secondary | ICD-10-CM | POA: Diagnosis not present

## 2021-03-09 DIAGNOSIS — R7303 Prediabetes: Secondary | ICD-10-CM | POA: Diagnosis not present

## 2021-03-26 IMAGING — CT CT ABD-PELV W/ CM
2 of 5 series · 16 of 46 positions shown, 18 images · IV contrast (omnipaque)
Comparison: None.

CLINICAL DATA: Left lower quadrant pain

EXAM:
CT ABDOMEN AND PELVIS WITH CONTRAST
TECHNIQUE: Multidetector CT imaging of the abdomen and pelvis was performed
using the standard protocol following bolus administration of
intravenous contrast.
CONTRAST:  100mL OMNIPAQUE IOHEXOL 300 MG/ML  SOLN

[Series 2: axial st · axial · 0.78mm/px · z∈[+1062,+1462]mm · 13 of 92 slices shown, 15 images]
[im 6/92  soft-tissue]
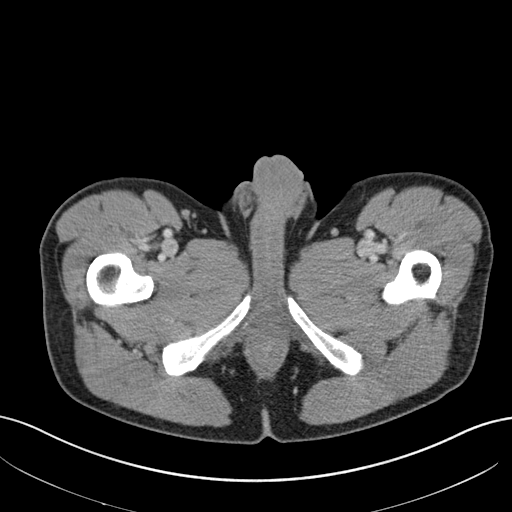
[im 6/92  bone]
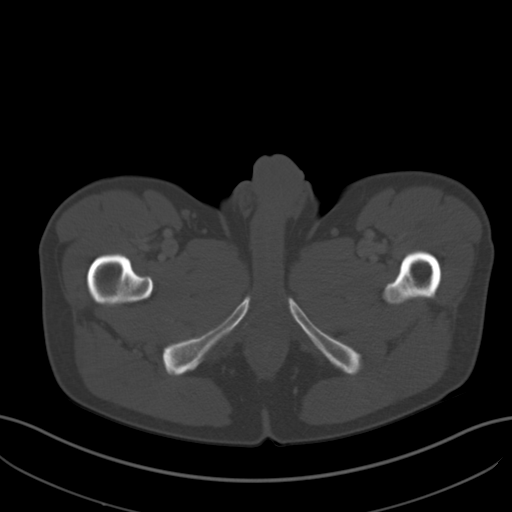
[im 12/92  soft-tissue]
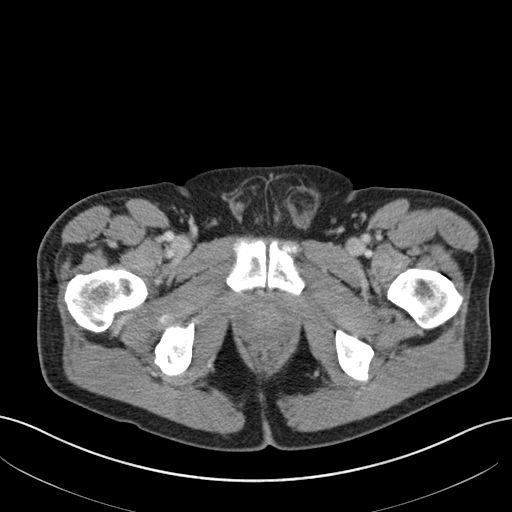
[im 18/92  soft-tissue]
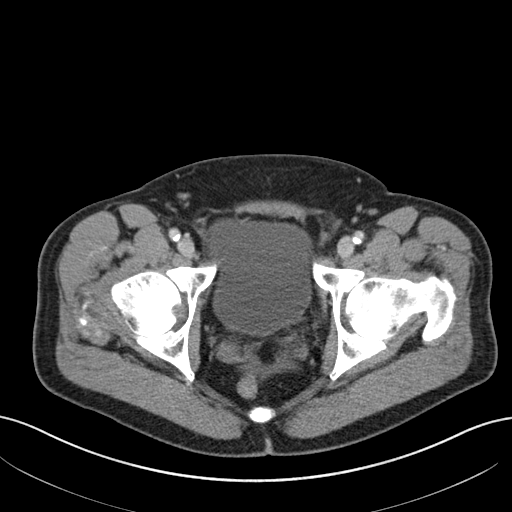
[im 29/92  soft-tissue]
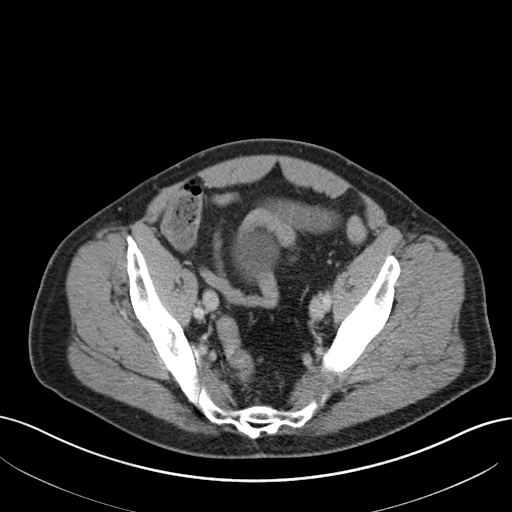
[im 35/92  soft-tissue]
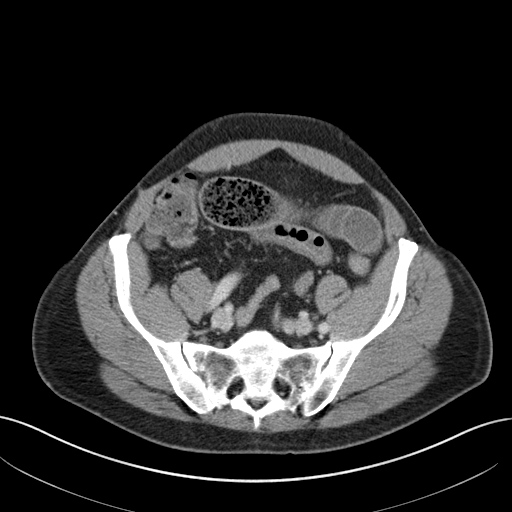
[im 40/92  soft-tissue]
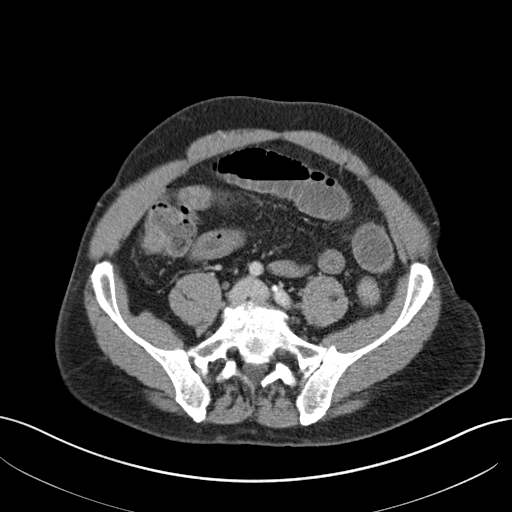
[im 46/92  soft-tissue]
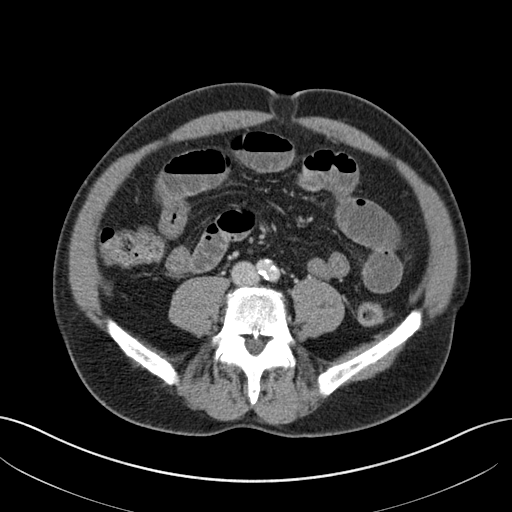
[im 52/92  soft-tissue]
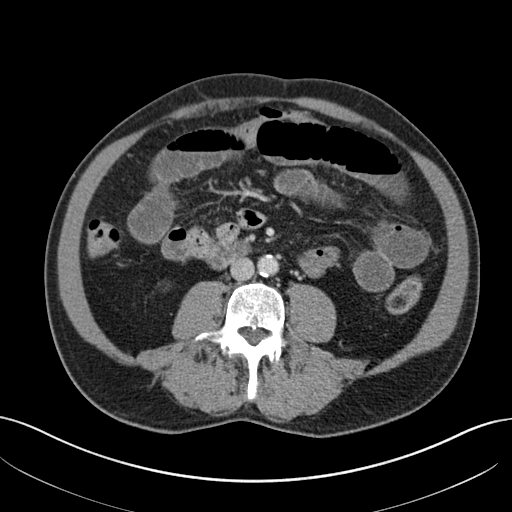
[im 57/92  soft-tissue]
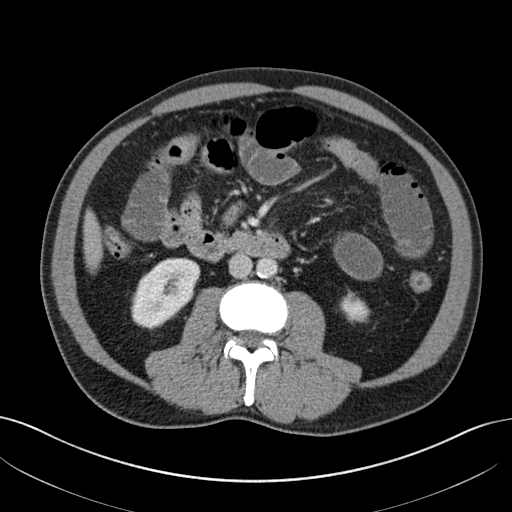
[im 57/92  bone]
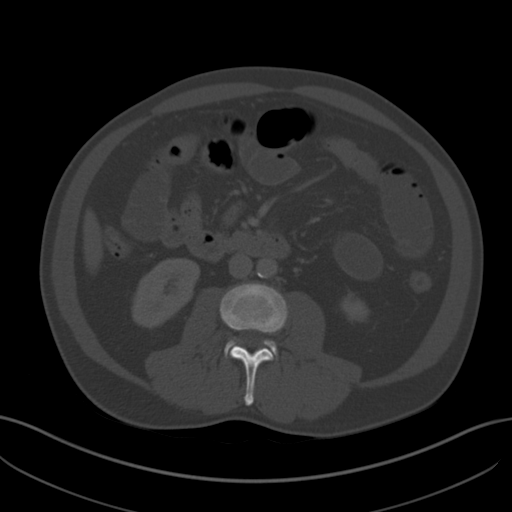
[im 63/92  soft-tissue]
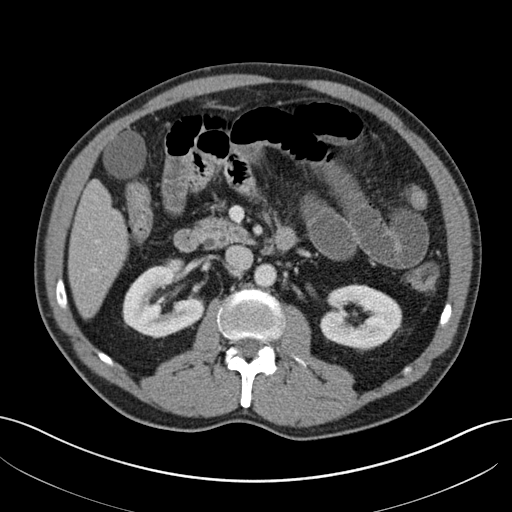
[im 74/92  soft-tissue]
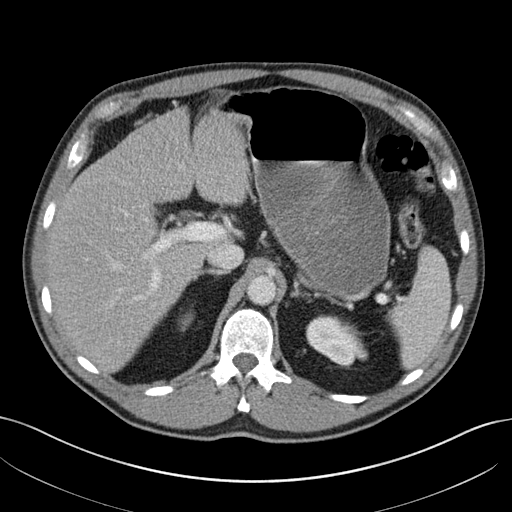
[im 80/92  soft-tissue]
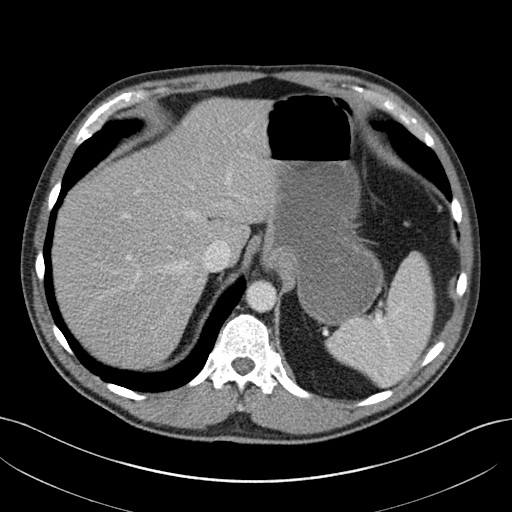
[im 86/92  soft-tissue]
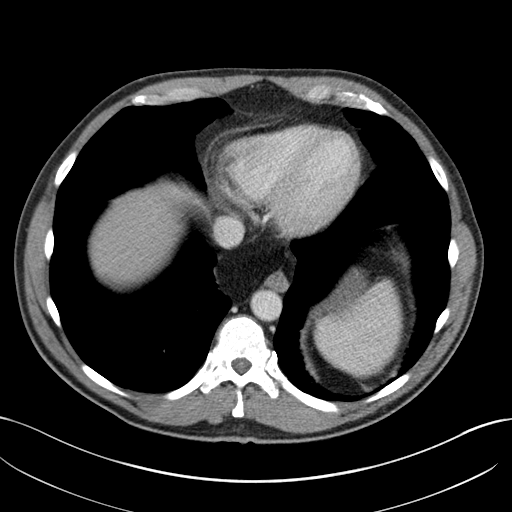

[Series 5: coronal st · coronal · 0.76mm/px · 3 of 155 slices shown]
[im 52/155  soft-tissue]
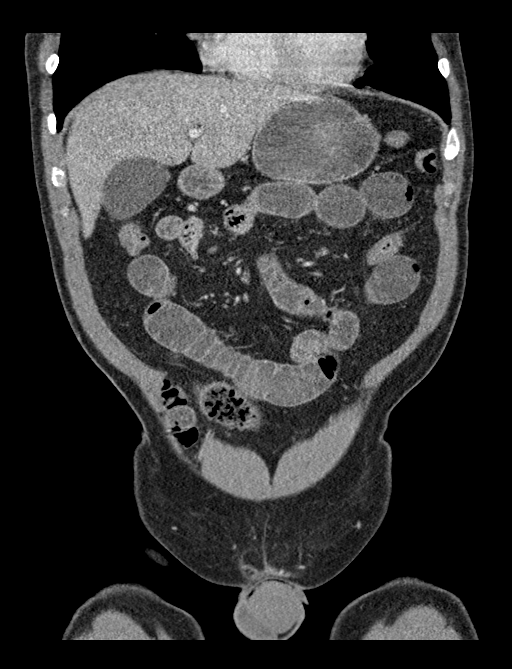
[im 69/155  soft-tissue]
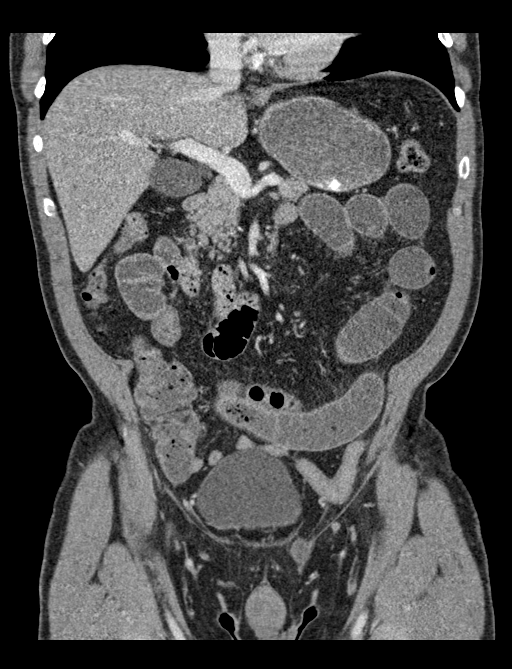
[im 86/155  soft-tissue]
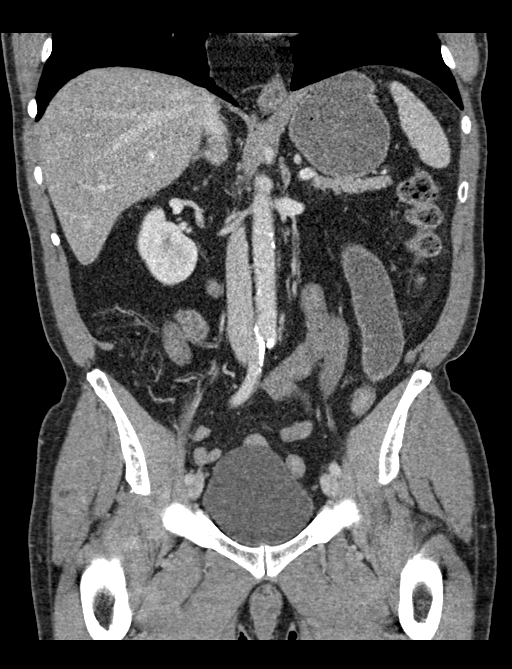

[16 of 46 positions shown; findings below may reference images not displayed]

FINDINGS: LOWER CHEST: Normal.

HEPATOBILIARY: Normal hepatic contours. No intra- or extrahepatic
biliary dilatation. There is cholelithiasis without acute
inflammation.

PANCREAS: Normal pancreas. No ductal dilatation or peripancreatic
fluid collection.

SPLEEN: Normal.

ADRENALS/URINARY TRACT: The adrenal glands are normal. No
hydronephrosis, nephroureterolithiasis or solid renal mass. The
urinary bladder is normal for degree of distention

STOMACH/BOWEL: There is no hiatal hernia. Normal duodenal course and
caliber. Proximal small bowel is dilated. There is a relative
transition point in the right lower quadrant (series image 61) where
there is a focal area narrowing with fecalization proximal small
contents. Sigmoid diverticulosis acute inflammation. Status post
appendectomy.

VASCULAR/LYMPHATIC: There is calcific atherosclerosis of the
abdominal aorta. No lymphadenopathy.

REPRODUCTIVE: Normal prostate size with symmetric seminal vesicles.

MUSCULOSKELETAL. No bony spinal canal stenosis or focal osseous
abnormality.

OTHER: None.
IMPRESSION: 1. Partial small-bowel obstruction with relative transition point in
the right lower quadrant at a site of focal narrowing with
fecalization of the more proximal small bowel contents.
2. Sigmoid diverticulosis without acute inflammation.

Aortic Atherosclerosis ([id number]).

## 2021-06-02 DIAGNOSIS — S0502XA Injury of conjunctiva and corneal abrasion without foreign body, left eye, initial encounter: Secondary | ICD-10-CM | POA: Diagnosis not present

## 2021-06-18 DIAGNOSIS — R197 Diarrhea, unspecified: Secondary | ICD-10-CM | POA: Diagnosis not present

## 2021-08-07 DIAGNOSIS — H524 Presbyopia: Secondary | ICD-10-CM | POA: Diagnosis not present

## 2021-08-07 DIAGNOSIS — H2513 Age-related nuclear cataract, bilateral: Secondary | ICD-10-CM | POA: Diagnosis not present

## 2021-08-20 ENCOUNTER — Ambulatory Visit: Payer: Medicare HMO | Admitting: Physician Assistant

## 2021-08-20 ENCOUNTER — Encounter: Payer: Self-pay | Admitting: Physician Assistant

## 2021-08-20 ENCOUNTER — Other Ambulatory Visit: Payer: Self-pay

## 2021-08-20 DIAGNOSIS — L820 Inflamed seborrheic keratosis: Secondary | ICD-10-CM

## 2021-08-20 DIAGNOSIS — Z1283 Encounter for screening for malignant neoplasm of skin: Secondary | ICD-10-CM | POA: Diagnosis not present

## 2021-08-20 DIAGNOSIS — Z86018 Personal history of other benign neoplasm: Secondary | ICD-10-CM

## 2021-08-20 NOTE — Progress Notes (Signed)
   Follow-Up Visit   Subjective  Jeffrey Wong is a 75 y.o. male who presents for the following: Annual Exam (No new concerns. Personal history of atypical nevus, but no melanoma or non mole skin cancers. Patient denies any family history of melanoma or non mole skin cancers. ).   The following portions of the chart were reviewed this encounter and updated as appropriate:  Tobacco  Allergies  Meds  Problems  Med Hx  Surg Hx  Fam Hx      Objective  Well appearing patient in no apparent distress; mood and affect are within normal limits.  A full examination was performed including scalp, head, eyes, ears, nose, lips, neck, chest, axillae, abdomen, back, buttocks, bilateral upper extremities, bilateral lower extremities, hands, feet, fingers, toes, fingernails, and toenails. All findings within normal limits unless otherwise noted below.  Chest (Upper Torso, Anterior) (4), Head (8), Left Leg (5), Right Leg, Torso - Posterior (Back) Small brown crusts   Assessment & Plan  Seborrheic keratosis, inflamed Chest (Upper Torso, Anterior) (4); Torso - Posterior (Back); Head (8); Left Leg (5); Right Leg  Destruction of lesion - Chest (Upper Torso, Anterior), Head, Left Leg, Right Leg, Torso - Posterior (Back) Complexity: simple   Destruction method: cryotherapy   Informed consent: discussed and consent obtained   Timeout:  patient name, date of birth, surgical site, and procedure verified Lesion destroyed using liquid nitrogen: Yes   Cryotherapy cycles:  3 Outcome: patient tolerated procedure well with no complications      I, Matthieu Loftus, PA-C, have reviewed all documentation's for this visit.  The documentation on 08/20/21 for the exam, diagnosis, procedures and orders are all accurate and complete.

## 2021-09-18 DIAGNOSIS — I10 Essential (primary) hypertension: Secondary | ICD-10-CM | POA: Diagnosis not present

## 2021-09-18 DIAGNOSIS — Z125 Encounter for screening for malignant neoplasm of prostate: Secondary | ICD-10-CM | POA: Diagnosis not present

## 2021-09-18 DIAGNOSIS — K219 Gastro-esophageal reflux disease without esophagitis: Secondary | ICD-10-CM | POA: Diagnosis not present

## 2021-09-18 DIAGNOSIS — Z0000 Encounter for general adult medical examination without abnormal findings: Secondary | ICD-10-CM | POA: Diagnosis not present

## 2021-09-18 DIAGNOSIS — N183 Chronic kidney disease, stage 3 unspecified: Secondary | ICD-10-CM | POA: Diagnosis not present

## 2021-09-18 DIAGNOSIS — R7303 Prediabetes: Secondary | ICD-10-CM | POA: Diagnosis not present

## 2021-09-18 DIAGNOSIS — E782 Mixed hyperlipidemia: Secondary | ICD-10-CM | POA: Diagnosis not present

## 2021-10-09 DIAGNOSIS — W274XXA Contact with kitchen utensil, initial encounter: Secondary | ICD-10-CM | POA: Diagnosis not present

## 2021-10-09 DIAGNOSIS — S61012A Laceration without foreign body of left thumb without damage to nail, initial encounter: Secondary | ICD-10-CM | POA: Diagnosis not present

## 2022-02-17 ENCOUNTER — Encounter: Payer: Self-pay | Admitting: Physician Assistant

## 2022-02-17 ENCOUNTER — Ambulatory Visit: Payer: Medicare HMO | Admitting: Physician Assistant

## 2022-02-17 ENCOUNTER — Other Ambulatory Visit: Payer: Self-pay

## 2022-02-17 DIAGNOSIS — Z1283 Encounter for screening for malignant neoplasm of skin: Secondary | ICD-10-CM

## 2022-02-17 DIAGNOSIS — Z86018 Personal history of other benign neoplasm: Secondary | ICD-10-CM | POA: Diagnosis not present

## 2022-02-17 DIAGNOSIS — L570 Actinic keratosis: Secondary | ICD-10-CM

## 2022-02-17 DIAGNOSIS — L820 Inflamed seborrheic keratosis: Secondary | ICD-10-CM | POA: Diagnosis not present

## 2022-02-17 NOTE — Progress Notes (Signed)
? ?  Follow-Up Visit ?  ?Subjective  ?Jeffrey Wong is a 76 y.o. male who presents for the following: Follow-up (Patient here today for 6 month follow up no concerns. Personal history of atypical mole. No personal history of melanoma or non mole skin cancer. No family history of atypical moles, melanoma or non mole skin cancer. ). ? ? ?The following portions of the chart were reviewed this encounter and updated as appropriate:  Tobacco  Allergies  Meds  Problems  Med Hx  Surg Hx  Fam Hx   ?  ? ?Objective  ?Well appearing patient in no apparent distress; mood and affect are within normal limits. ? ?A full examination was performed including scalp, head, eyes, ears, nose, lips, neck, chest, axillae, abdomen, back, buttocks, bilateral upper extremities, bilateral lower extremities, hands, feet, fingers, toes, fingernails, and toenails. All findings within normal limits unless otherwise noted below. ? ?Chest - Medial (Center) (3), Head - Anterior (Face), Left Abdomen (side) - Upper, Left Breast, Left Foot - Anterior, Left Lower Leg - Anterior (9), Left Thigh - Anterior (5), Left Upper Arm - Anterior (10), Right Abdomen (side) - Upper (4), Right Breast, Right Lower Leg - Anterior, Right Thigh - Anterior (5), Right Upper Arm - Anterior ?Stuck-on, waxy papules and plaques.  ? ?Mid Chin (3), Neck - Anterior (6) ?Erythematous patches with gritty scale. ? ? ?Assessment & Plan  ?Inflamed seborrheic keratosis (43) ?Head - Anterior (Face); Left Foot - Anterior; Left Upper Arm - Anterior (10); Right Upper Arm - Anterior; Left Thigh - Anterior (5); Right Thigh - Anterior (5); Left Lower Leg - Anterior (9); Right Lower Leg - Anterior; Chest - Medial (Center) (3); Left Breast; Right Breast; Left Abdomen (side) - Upper; Right Abdomen (side) - Upper (4) ? ?Destruction of lesion - Chest - Medial [facility]), Left Lower Leg - Anterior, Left Thigh - Anterior, Right Lower Leg - Anterior, Right Thigh - Anterior, Right Upper Arm -  Anterior ?Complexity: simple   ?Destruction method: cryotherapy   ?Informed consent: discussed and consent obtained   ?Timeout:  patient name, date of birth, surgical site, and procedure verified ?Lesion destroyed using liquid nitrogen: Yes   ?Cryotherapy cycles:  1 ?Outcome: patient tolerated procedure well with no complications   ?Post-procedure details: wound care instructions given   ? ?AK (actinic keratosis) (9) ?Neck - Anterior (6); Mid Chin (3) ? ?Destruction of lesion - Mid Chin, Neck - Anterior ?Complexity: simple   ?Destruction method: cryotherapy   ?Informed consent: discussed and consent obtained   ?Timeout:  patient name, date of birth, surgical site, and procedure verified ?Lesion destroyed using liquid nitrogen: Yes   ?Cryotherapy cycles:  1 ?Outcome: patient tolerated procedure well with no complications   ?Post-procedure details: wound care instructions given   ? ?No atypical nevi noted at the time of the visit. ? ?I, Todd Jelinski, PA-C, have reviewed all documentation's for this visit.  The documentation on 02/17/22 for the exam, diagnosis, procedures and orders are all accurate and complete. ?

## 2022-03-30 DIAGNOSIS — K219 Gastro-esophageal reflux disease without esophagitis: Secondary | ICD-10-CM | POA: Diagnosis not present

## 2022-03-30 DIAGNOSIS — I10 Essential (primary) hypertension: Secondary | ICD-10-CM | POA: Diagnosis not present

## 2022-03-30 DIAGNOSIS — N183 Chronic kidney disease, stage 3 unspecified: Secondary | ICD-10-CM | POA: Diagnosis not present

## 2022-03-30 DIAGNOSIS — E782 Mixed hyperlipidemia: Secondary | ICD-10-CM | POA: Diagnosis not present

## 2022-03-30 DIAGNOSIS — R7303 Prediabetes: Secondary | ICD-10-CM | POA: Diagnosis not present

## 2022-07-06 ENCOUNTER — Telehealth: Payer: Self-pay | Admitting: Physician Assistant

## 2022-07-06 NOTE — Telephone Encounter (Signed)
Patient requested phone call from [facility], [place].

## 2022-07-07 ENCOUNTER — Encounter: Payer: Self-pay | Admitting: Physician Assistant

## 2022-08-13 DIAGNOSIS — H524 Presbyopia: Secondary | ICD-10-CM | POA: Diagnosis not present

## 2022-08-13 DIAGNOSIS — H2513 Age-related nuclear cataract, bilateral: Secondary | ICD-10-CM | POA: Diagnosis not present

## 2022-08-13 DIAGNOSIS — H04123 Dry eye syndrome of bilateral lacrimal glands: Secondary | ICD-10-CM | POA: Diagnosis not present

## 2022-08-25 ENCOUNTER — Ambulatory Visit: Payer: Medicare HMO | Admitting: Dermatology

## 2022-10-01 DIAGNOSIS — E782 Mixed hyperlipidemia: Secondary | ICD-10-CM | POA: Diagnosis not present

## 2022-10-01 DIAGNOSIS — N183 Chronic kidney disease, stage 3 unspecified: Secondary | ICD-10-CM | POA: Diagnosis not present

## 2022-10-01 DIAGNOSIS — Z0000 Encounter for general adult medical examination without abnormal findings: Secondary | ICD-10-CM | POA: Diagnosis not present

## 2022-10-01 DIAGNOSIS — K219 Gastro-esophageal reflux disease without esophagitis: Secondary | ICD-10-CM | POA: Diagnosis not present

## 2022-10-01 DIAGNOSIS — I10 Essential (primary) hypertension: Secondary | ICD-10-CM | POA: Diagnosis not present

## 2022-10-01 DIAGNOSIS — Z125 Encounter for screening for malignant neoplasm of prostate: Secondary | ICD-10-CM | POA: Diagnosis not present

## 2022-10-01 DIAGNOSIS — R7303 Prediabetes: Secondary | ICD-10-CM | POA: Diagnosis not present

## 2022-11-30 DIAGNOSIS — M67449 Ganglion, unspecified hand: Secondary | ICD-10-CM | POA: Diagnosis not present

## 2022-12-07 DIAGNOSIS — M67441 Ganglion, right hand: Secondary | ICD-10-CM | POA: Diagnosis not present

## 2022-12-07 DIAGNOSIS — M79642 Pain in left hand: Secondary | ICD-10-CM | POA: Diagnosis not present

## 2022-12-07 DIAGNOSIS — M180 Bilateral primary osteoarthritis of first carpometacarpal joints: Secondary | ICD-10-CM | POA: Diagnosis not present

## 2022-12-07 DIAGNOSIS — M79641 Pain in right hand: Secondary | ICD-10-CM | POA: Diagnosis not present

## 2023-03-08 DIAGNOSIS — D229 Melanocytic nevi, unspecified: Secondary | ICD-10-CM | POA: Diagnosis not present

## 2023-03-08 DIAGNOSIS — Z1283 Encounter for screening for malignant neoplasm of skin: Secondary | ICD-10-CM | POA: Diagnosis not present

## 2023-03-08 DIAGNOSIS — L568 Other specified acute skin changes due to ultraviolet radiation: Secondary | ICD-10-CM | POA: Diagnosis not present

## 2023-03-08 DIAGNOSIS — C44519 Basal cell carcinoma of skin of other part of trunk: Secondary | ICD-10-CM | POA: Diagnosis not present

## 2023-03-08 DIAGNOSIS — L821 Other seborrheic keratosis: Secondary | ICD-10-CM | POA: Diagnosis not present

## 2023-03-08 DIAGNOSIS — D485 Neoplasm of uncertain behavior of skin: Secondary | ICD-10-CM | POA: Diagnosis not present

## 2023-03-08 DIAGNOSIS — L570 Actinic keratosis: Secondary | ICD-10-CM | POA: Diagnosis not present

## 2023-04-01 DIAGNOSIS — Z1211 Encounter for screening for malignant neoplasm of colon: Secondary | ICD-10-CM | POA: Diagnosis not present

## 2023-04-01 DIAGNOSIS — R7303 Prediabetes: Secondary | ICD-10-CM | POA: Diagnosis not present

## 2023-04-01 DIAGNOSIS — E782 Mixed hyperlipidemia: Secondary | ICD-10-CM | POA: Diagnosis not present

## 2023-04-01 DIAGNOSIS — I10 Essential (primary) hypertension: Secondary | ICD-10-CM | POA: Diagnosis not present

## 2023-04-01 DIAGNOSIS — K219 Gastro-esophageal reflux disease without esophagitis: Secondary | ICD-10-CM | POA: Diagnosis not present

## 2023-04-01 DIAGNOSIS — N183 Chronic kidney disease, stage 3 unspecified: Secondary | ICD-10-CM | POA: Diagnosis not present

## 2023-04-01 DIAGNOSIS — J309 Allergic rhinitis, unspecified: Secondary | ICD-10-CM | POA: Diagnosis not present

## 2023-04-06 DIAGNOSIS — Z1211 Encounter for screening for malignant neoplasm of colon: Secondary | ICD-10-CM | POA: Diagnosis not present

## 2023-04-12 DIAGNOSIS — D485 Neoplasm of uncertain behavior of skin: Secondary | ICD-10-CM | POA: Diagnosis not present

## 2023-04-12 DIAGNOSIS — L570 Actinic keratosis: Secondary | ICD-10-CM | POA: Diagnosis not present

## 2023-04-12 DIAGNOSIS — C44519 Basal cell carcinoma of skin of other part of trunk: Secondary | ICD-10-CM | POA: Diagnosis not present

## 2023-04-30 DIAGNOSIS — M1A0720 Idiopathic chronic gout, left ankle and foot, without tophus (tophi): Secondary | ICD-10-CM | POA: Diagnosis not present

## 2023-05-10 DIAGNOSIS — C44519 Basal cell carcinoma of skin of other part of trunk: Secondary | ICD-10-CM | POA: Diagnosis not present

## 2023-05-10 DIAGNOSIS — L905 Scar conditions and fibrosis of skin: Secondary | ICD-10-CM | POA: Diagnosis not present

## 2023-05-19 DIAGNOSIS — R195 Other fecal abnormalities: Secondary | ICD-10-CM | POA: Diagnosis not present

## 2023-05-19 DIAGNOSIS — R198 Other specified symptoms and signs involving the digestive system and abdomen: Secondary | ICD-10-CM | POA: Diagnosis not present

## 2023-06-21 DIAGNOSIS — K219 Gastro-esophageal reflux disease without esophagitis: Secondary | ICD-10-CM | POA: Diagnosis not present

## 2023-06-21 DIAGNOSIS — K2270 Barrett's esophagus without dysplasia: Secondary | ICD-10-CM | POA: Diagnosis not present

## 2023-06-21 DIAGNOSIS — K648 Other hemorrhoids: Secondary | ICD-10-CM | POA: Diagnosis not present

## 2023-06-21 DIAGNOSIS — K5730 Diverticulosis of large intestine without perforation or abscess without bleeding: Secondary | ICD-10-CM | POA: Diagnosis not present

## 2023-06-21 DIAGNOSIS — K635 Polyp of colon: Secondary | ICD-10-CM | POA: Diagnosis not present

## 2023-06-21 DIAGNOSIS — R195 Other fecal abnormalities: Secondary | ICD-10-CM | POA: Diagnosis not present

## 2023-06-21 DIAGNOSIS — K2930 Chronic superficial gastritis without bleeding: Secondary | ICD-10-CM | POA: Diagnosis not present

## 2023-06-21 DIAGNOSIS — K317 Polyp of stomach and duodenum: Secondary | ICD-10-CM | POA: Diagnosis not present

## 2023-07-05 DIAGNOSIS — K2270 Barrett's esophagus without dysplasia: Secondary | ICD-10-CM | POA: Diagnosis not present

## 2023-07-05 DIAGNOSIS — K2930 Chronic superficial gastritis without bleeding: Secondary | ICD-10-CM | POA: Diagnosis not present

## 2023-07-05 DIAGNOSIS — K635 Polyp of colon: Secondary | ICD-10-CM | POA: Diagnosis not present

## 2023-08-06 DIAGNOSIS — M79675 Pain in left toe(s): Secondary | ICD-10-CM | POA: Diagnosis not present

## 2023-08-06 DIAGNOSIS — M7732 Calcaneal spur, left foot: Secondary | ICD-10-CM | POA: Diagnosis not present

## 2023-08-06 DIAGNOSIS — M109 Gout, unspecified: Secondary | ICD-10-CM | POA: Diagnosis not present

## 2023-08-06 DIAGNOSIS — M19072 Primary osteoarthritis, left ankle and foot: Secondary | ICD-10-CM | POA: Diagnosis not present

## 2023-08-06 DIAGNOSIS — M79672 Pain in left foot: Secondary | ICD-10-CM | POA: Diagnosis not present

## 2023-08-18 DIAGNOSIS — M79674 Pain in right toe(s): Secondary | ICD-10-CM | POA: Diagnosis not present

## 2023-08-18 DIAGNOSIS — M19071 Primary osteoarthritis, right ankle and foot: Secondary | ICD-10-CM | POA: Diagnosis not present

## 2023-08-22 DIAGNOSIS — H2513 Age-related nuclear cataract, bilateral: Secondary | ICD-10-CM | POA: Diagnosis not present

## 2023-08-22 DIAGNOSIS — H524 Presbyopia: Secondary | ICD-10-CM | POA: Diagnosis not present

## 2023-09-20 DIAGNOSIS — H524 Presbyopia: Secondary | ICD-10-CM | POA: Diagnosis not present

## 2023-10-20 DIAGNOSIS — E119 Type 2 diabetes mellitus without complications: Secondary | ICD-10-CM | POA: Diagnosis not present

## 2023-10-20 DIAGNOSIS — Z125 Encounter for screening for malignant neoplasm of prostate: Secondary | ICD-10-CM | POA: Diagnosis not present

## 2023-10-20 DIAGNOSIS — J309 Allergic rhinitis, unspecified: Secondary | ICD-10-CM | POA: Diagnosis not present

## 2023-10-20 DIAGNOSIS — E782 Mixed hyperlipidemia: Secondary | ICD-10-CM | POA: Diagnosis not present

## 2023-10-20 DIAGNOSIS — N183 Chronic kidney disease, stage 3 unspecified: Secondary | ICD-10-CM | POA: Diagnosis not present

## 2023-10-20 DIAGNOSIS — Z0000 Encounter for general adult medical examination without abnormal findings: Secondary | ICD-10-CM | POA: Diagnosis not present

## 2023-10-20 DIAGNOSIS — I10 Essential (primary) hypertension: Secondary | ICD-10-CM | POA: Diagnosis not present

## 2023-10-20 DIAGNOSIS — K219 Gastro-esophageal reflux disease without esophagitis: Secondary | ICD-10-CM | POA: Diagnosis not present

## 2024-01-11 DIAGNOSIS — K22719 Barrett's esophagus with dysplasia, unspecified: Secondary | ICD-10-CM | POA: Diagnosis not present

## 2024-01-11 DIAGNOSIS — K229 Disease of esophagus, unspecified: Secondary | ICD-10-CM | POA: Diagnosis not present

## 2024-04-25 ENCOUNTER — Other Ambulatory Visit (HOSPITAL_COMMUNITY): Payer: Self-pay | Admitting: Family Medicine

## 2024-04-25 DIAGNOSIS — Z136 Encounter for screening for cardiovascular disorders: Secondary | ICD-10-CM | POA: Diagnosis not present

## 2024-04-25 DIAGNOSIS — E782 Mixed hyperlipidemia: Secondary | ICD-10-CM | POA: Diagnosis not present

## 2024-04-25 DIAGNOSIS — E119 Type 2 diabetes mellitus without complications: Secondary | ICD-10-CM | POA: Diagnosis not present

## 2024-04-25 DIAGNOSIS — I10 Essential (primary) hypertension: Secondary | ICD-10-CM | POA: Diagnosis not present

## 2024-04-25 DIAGNOSIS — K219 Gastro-esophageal reflux disease without esophagitis: Secondary | ICD-10-CM | POA: Diagnosis not present

## 2024-04-25 DIAGNOSIS — J309 Allergic rhinitis, unspecified: Secondary | ICD-10-CM | POA: Diagnosis not present

## 2024-05-01 DIAGNOSIS — K449 Diaphragmatic hernia without obstruction or gangrene: Secondary | ICD-10-CM | POA: Diagnosis not present

## 2024-05-01 DIAGNOSIS — K22719 Barrett's esophagus with dysplasia, unspecified: Secondary | ICD-10-CM | POA: Diagnosis not present

## 2024-05-01 DIAGNOSIS — Z9049 Acquired absence of other specified parts of digestive tract: Secondary | ICD-10-CM | POA: Diagnosis not present

## 2024-05-01 DIAGNOSIS — K2270 Barrett's esophagus without dysplasia: Secondary | ICD-10-CM | POA: Diagnosis not present

## 2024-05-01 DIAGNOSIS — K22711 Barrett's esophagus with high grade dysplasia: Secondary | ICD-10-CM | POA: Diagnosis not present

## 2024-05-01 DIAGNOSIS — K219 Gastro-esophageal reflux disease without esophagitis: Secondary | ICD-10-CM | POA: Diagnosis not present

## 2024-05-01 DIAGNOSIS — E785 Hyperlipidemia, unspecified: Secondary | ICD-10-CM | POA: Diagnosis not present

## 2024-05-01 DIAGNOSIS — I10 Essential (primary) hypertension: Secondary | ICD-10-CM | POA: Diagnosis not present

## 2024-05-07 ENCOUNTER — Ambulatory Visit (HOSPITAL_COMMUNITY)
Admission: RE | Admit: 2024-05-07 | Discharge: 2024-05-07 | Disposition: A | Discharge status: Home / Self Care | Payer: Self-pay | Source: Ambulatory Visit | Attending: Family Medicine | Admitting: Family Medicine

## 2024-05-07 DIAGNOSIS — Z136 Encounter for screening for cardiovascular disorders: Secondary | ICD-10-CM | POA: Insufficient documentation

## 2024-06-05 DIAGNOSIS — K4020 Bilateral inguinal hernia, without obstruction or gangrene, not specified as recurrent: Secondary | ICD-10-CM | POA: Diagnosis not present

## 2024-06-05 DIAGNOSIS — K429 Umbilical hernia without obstruction or gangrene: Secondary | ICD-10-CM | POA: Diagnosis not present

## 2024-07-03 DIAGNOSIS — K4020 Bilateral inguinal hernia, without obstruction or gangrene, not specified as recurrent: Secondary | ICD-10-CM | POA: Diagnosis not present

## 2024-07-03 DIAGNOSIS — K429 Umbilical hernia without obstruction or gangrene: Secondary | ICD-10-CM | POA: Diagnosis not present

## 2024-11-08 DIAGNOSIS — E782 Mixed hyperlipidemia: Secondary | ICD-10-CM | POA: Diagnosis not present

## 2024-11-08 DIAGNOSIS — I10 Essential (primary) hypertension: Secondary | ICD-10-CM | POA: Diagnosis not present

## 2024-11-08 DIAGNOSIS — E119 Type 2 diabetes mellitus without complications: Secondary | ICD-10-CM | POA: Diagnosis not present

## 2024-11-08 DIAGNOSIS — G6289 Other specified polyneuropathies: Secondary | ICD-10-CM | POA: Diagnosis not present

## 2024-11-13 DIAGNOSIS — D492 Neoplasm of unspecified behavior of bone, soft tissue, and skin: Secondary | ICD-10-CM | POA: Diagnosis not present

## 2024-12-08 NOTE — H&P (View-Only) (Signed)
 " Cardiology Office Note:  .   Date:  12/10/2024 ID:  Jeffrey Wong, DOB 15-Jan-1946, MRN [id number] PCP: Jeffrey Elsie SAUNDERS, MD [facility] Providers Cardiologist:  None   Patient Profile: .      PMH Coronary artery disease CT calcium  score 05/07/24 CAC score 1216 (80th percentile) LM 0, LAD 620, LCx 350, RCA 247 Aortic atherosclerosis Small hiatal hernia Carotid artery disease Hypertension Type 2 diabetes A1C 6.5% on 04/25/24 A1C 5.9% on 11/08/24 Hyperlipidemia Former tobacco abuse Quit 1985       History of Present Illness: .   Discussed the use of AI scribe software for clinical note transcription with the patient, who gave verbal consent to proceed.  History of Present Illness Jeffrey Wong is a very pleasant 79 year old male who is here today for evaluation of elevated coronary calcium  score. He is a long-time patient of Dr. Arloa who recommended he see cardiology due to elevated CT calcium  score of 1216 (80th percentile) significant calcification in LAD, LCx, and RCA.  History of type 2 diabetes which is diet controlled and hypertension. His last HbA1c was 5.9%, improved from 6.5. He is not on cholesterol medication and does not want to take statins, preferring diet and exercise. He started bisoprolol  for significantly elevated several years ago, and his blood pressure has generally been well controlled since that time. He maintains regular strength training and walking, 5 to 6 days each week.  He has done this for years.  Admits he generally eats  anything and everything, particularly lots of eggs, sausage and bacon as well as a beef.  He reports significant family history of heart disease in his mother side of the family and she died from a heart attack in her 83s.  He quit smoking in 1985 and rarely drinks alcohol. In 2000, he had an episode of transient vision blackout while working out. Cardiac catheterization at that time showed no significant disease. He  denies chest pain, shortness of breath, orthopnea, PND, edema, palpitations, presyncope, syncope. He notes occasional generalized aches, which he attributes to exercise.  No change in exercise tolerance.  Hip pain sometimes impedes walking long distances.   Family history: His family history includes Brain cancer in his father; Heart disease in his mother; Hyperlipidemia in his father, mother, and sister; Hypertension in his mother and sister; Other in his father.  Father died Mother - heart attack, stroke, died age 55, started in 1s 58s, smoker Strong family history mother's side  Discussed the use of AI scribe software for clinical note transcription with the patient, who gave verbal consent to proceed.  Diet: Meat and potatoes diet typically Admits dietary indiscretion Coffee, water Rare Etoh  Activity: Strength training 5-6 days per week Walks daily  No results found for: LIPOA    ROS: See HPI       Studies Reviewed: SABRA   EKG Interpretation Date/Time:  Monday December 10 2024 13:26:46 EST Ventricular Rate:  60 PR Interval:  228 QRS Duration:  106 QT Interval:  432 QTC Calculation: 432 R Axis:   52  Text Interpretation: Sinus rhythm with 1st degree A-V block Septal infarct , age undetermined No previous ECGs available Confirmed by Jeffrey Wong [phone]) on 12/10/2024 1:37:38 PM      Risk Assessment/Calculations:             Physical Exam:   VS: BP 122/74   Pulse 60   Ht 5' 8 (1.727 m)  Wt 177 lb (80.3 kg)   SpO2 98%   BMI 26.91 kg/m   Wt Readings from Last 3 Encounters:  12/10/24 177 lb (80.3 kg)  10/04/20 188 lb (85.3 kg)  02/10/16 188 lb (85.3 kg)     GEN: Well nourished, well developed in no acute distress NECK: No JVD; No carotid bruits CARDIAC: RRR, systolic murmur bil USB, No rubs, gallops RESPIRATORY:  Clear to auscultation without rales, wheezing or rhonchi  ABDOMEN: Soft, non-tender, non-distended EXTREMITIES:  No edema; No deformity      ASSESSMENT AND PLAN: .    Assessment & Plan Coronary artery disease  Cardiac risk CT calcium  score 1216 (80th percentile) with significant calcification in LAD, LCx, and RCA. He is asymptomatic with no chest pain, shortness of breath, or other symptoms of angina with exertion.  He has been active with regular strength training and walking 5 to 6 days/week for many years.  Family history is significant for heart disease on his mother side including his mother who passed away from MI in her 41s, however he thinks she developed problems prior to that.  LDL 114 on 11/08/2024.  He is resistant to starting statin therapy.  Consider other lipid-lowering therapies but would like to work on better diet and retest in 2 months.  No significant ST/T abnormality on EKG.  Due to significant calcification and additional risk factors of hyperlipidemia and hypertension, we will proceed with ischemia evaluation. - Coronary CT for evaluation of ischemia and mapping of coronary anatomy - Take bisoprolol  10 mg 2 hours prior to test - Start aspirin  81 mg daily - Lipid management as noted below - Continue bisoprolol  daily - We reviewed red flag symptoms to report - Continue regular exercise - Heart healthy diet avoiding processed foods, saturated fat, sugar, and other simple carbohydrates encouraged Addendum: Coronary CT with hemodynamically significant stenosis by CT FFR in ostial LCx, mid LAD, moderate ramus. Recommended for cardiac catheterization.  Reviewed risks of cardiac cath with patient who verbalized understanding and agrees to proceed.  Advised him to start aspirin  81 mg daily.  Informed Consent   Shared Decision Making/Informed Consent The risks [stroke (1 in 1000), death (1 in 1000), kidney failure [usually temporary] (1 in 500), bleeding (1 in 200), allergic reaction [possibly serious] (1 in 200)], benefits (diagnostic support and management of coronary artery disease) and alternatives of a cardiac  catheterization were discussed in detail with Jeffrey Wong and he is willing to proceed.      Primary hypertension   Hypertension is managed with bisoprolol , and his blood pressure is generally well-controlled, though it was elevated during a recent visit, likely due to situational factors. Continue bisoprolol  10 mg in the morning and 5 mg at night.  - Continue bisoprolol  - Regular blood pressure monitoring is advised  Hypertriglyceridemia Hyperlipidemia LDL goal < 55 Lipid panel 87/88/74 total cholesterol 221, HDL 42, triglycerides 375, LDL-C 114.  History of hyperlipidemia and pacifically hypertriglyceridemia with triglycerides > 500 in the past.  He admits to dietary indiscretion including regular intake of bacon, sausage, pancakes, eggs, beef.  He is opposed to starting statin but does not give specific reason why. He prefers dietary management over statins, and dietary modifications to reduce triglycerides have been discussed. Implement dietary changes focusing on reducing intake of bacon, sausage, pancakes, and maple syrup.  Will consider other lipid-lowering therapies if lipids remain elevated after 2 months of dietary modification.  - - Recheck lipid panel in two months, including apolipoprotein B  and lipoprotein(a) - Dietary modifications encouraged to reduce intake of saturated fat, processed food, sugar and other simple carbohydrates  Type 2 diabetes mellitus   Was diagnosed with type 2 a few months ago with improvement of A1c to 5.9% with dietary modification.   -  Continue dietary modification to reduce intake of sugar and other simple carbohydrates, processed foods, and saturated fat - Management per PCP  Murmur   Systolic murmur noted in upper chest. He is asymptomatic. - Will get echocardiogram to evaluate for structural heart disease       Dispo: 3 months with me  Signed, Rosaline Bane, NP-C "

## 2024-12-08 NOTE — Progress Notes (Addendum)
 " Cardiology Office Note:  .   Date:  12/10/2024 ID:  Jeffrey Wong, DOB 15-Jan-1946, MRN [id number] PCP: Arloa Elsie SAUNDERS, MD [facility] Providers Cardiologist:  None   Patient Profile: .      PMH Coronary artery disease CT calcium  score 05/07/24 CAC score 1216 (80th percentile) LM 0, LAD 620, LCx 350, RCA 247 Aortic atherosclerosis Small hiatal hernia Carotid artery disease Hypertension Type 2 diabetes A1C 6.5% on 04/25/24 A1C 5.9% on 11/08/24 Hyperlipidemia Former tobacco abuse Quit 1985       History of Present Illness: .   Discussed the use of AI scribe software for clinical note transcription with the patient, who gave verbal consent to proceed.  History of Present Illness Jeffrey Wong is a very pleasant 79 year old male who is here today for evaluation of elevated coronary calcium  score. He is a long-time patient of Dr. Arloa who recommended he see cardiology due to elevated CT calcium  score of 1216 (80th percentile) significant calcification in LAD, LCx, and RCA.  History of type 2 diabetes which is diet controlled and hypertension. His last HbA1c was 5.9%, improved from 6.5. He is not on cholesterol medication and does not want to take statins, preferring diet and exercise. He started bisoprolol  for significantly elevated several years ago, and his blood pressure has generally been well controlled since that time. He maintains regular strength training and walking, 5 to 6 days each week.  He has done this for years.  Admits he generally eats  anything and everything, particularly lots of eggs, sausage and bacon as well as a beef.  He reports significant family history of heart disease in his mother side of the family and she died from a heart attack in her 83s.  He quit smoking in 1985 and rarely drinks alcohol. In 2000, he had an episode of transient vision blackout while working out. Cardiac catheterization at that time showed no significant disease. He  denies chest pain, shortness of breath, orthopnea, PND, edema, palpitations, presyncope, syncope. He notes occasional generalized aches, which he attributes to exercise.  No change in exercise tolerance.  Hip pain sometimes impedes walking long distances.   Family history: His family history includes Brain cancer in his father; Heart disease in his mother; Hyperlipidemia in his father, mother, and sister; Hypertension in his mother and sister; Other in his father.  Father died Mother - heart attack, stroke, died age 55, started in 1s 58s, smoker Strong family history mother's side  Discussed the use of AI scribe software for clinical note transcription with the patient, who gave verbal consent to proceed.  Diet: Meat and potatoes diet typically Admits dietary indiscretion Coffee, water Rare Etoh  Activity: Strength training 5-6 days per week Walks daily  No results found for: LIPOA    ROS: See HPI       Studies Reviewed: SABRA   EKG Interpretation Date/Time:  Monday December 10 2024 13:26:46 EST Ventricular Rate:  60 PR Interval:  228 QRS Duration:  106 QT Interval:  432 QTC Calculation: 432 R Axis:   52  Text Interpretation: Sinus rhythm with 1st degree A-V block Septal infarct , age undetermined No previous ECGs available Confirmed by Percy Browning [phone]) on 12/10/2024 1:37:38 PM      Risk Assessment/Calculations:             Physical Exam:   VS: BP 122/74   Pulse 60   Ht 5' 8 (1.727 m)  Wt 177 lb (80.3 kg)   SpO2 98%   BMI 26.91 kg/m   Wt Readings from Last 3 Encounters:  12/10/24 177 lb (80.3 kg)  10/04/20 188 lb (85.3 kg)  02/10/16 188 lb (85.3 kg)     GEN: Well nourished, well developed in no acute distress NECK: No JVD; No carotid bruits CARDIAC: RRR, systolic murmur bil USB, No rubs, gallops RESPIRATORY:  Clear to auscultation without rales, wheezing or rhonchi  ABDOMEN: Soft, non-tender, non-distended EXTREMITIES:  No edema; No deformity      ASSESSMENT AND PLAN: .    Assessment & Plan Coronary artery disease  Cardiac risk CT calcium  score 1216 (80th percentile) with significant calcification in LAD, LCx, and RCA. He is asymptomatic with no chest pain, shortness of breath, or other symptoms of angina with exertion.  He has been active with regular strength training and walking 5 to 6 days/week for many years.  Family history is significant for heart disease on his mother side including his mother who passed away from MI in her 41s, however he thinks she developed problems prior to that.  LDL 114 on 11/08/2024.  He is resistant to starting statin therapy.  Consider other lipid-lowering therapies but would like to work on better diet and retest in 2 months.  No significant ST/T abnormality on EKG.  Due to significant calcification and additional risk factors of hyperlipidemia and hypertension, we will proceed with ischemia evaluation. - Coronary CT for evaluation of ischemia and mapping of coronary anatomy - Take bisoprolol  10 mg 2 hours prior to test - Start aspirin  81 mg daily - Lipid management as noted below - Continue bisoprolol  daily - We reviewed red flag symptoms to report - Continue regular exercise - Heart healthy diet avoiding processed foods, saturated fat, sugar, and other simple carbohydrates encouraged Addendum: Coronary CT with hemodynamically significant stenosis by CT FFR in ostial LCx, mid LAD, moderate ramus. Recommended for cardiac catheterization.  Reviewed risks of cardiac cath with patient who verbalized understanding and agrees to proceed.  Advised him to start aspirin  81 mg daily.  Informed Consent   Shared Decision Making/Informed Consent The risks [stroke (1 in 1000), death (1 in 1000), kidney failure [usually temporary] (1 in 500), bleeding (1 in 200), allergic reaction [possibly serious] (1 in 200)], benefits (diagnostic support and management of coronary artery disease) and alternatives of a cardiac  catheterization were discussed in detail with [name] and he is willing to proceed.      Primary hypertension   Hypertension is managed with bisoprolol , and his blood pressure is generally well-controlled, though it was elevated during a recent visit, likely due to situational factors. Continue bisoprolol  10 mg in the morning and 5 mg at night.  - Continue bisoprolol  - Regular blood pressure monitoring is advised  Hypertriglyceridemia Hyperlipidemia LDL goal < 55 Lipid panel 87/88/74 total cholesterol 221, HDL 42, triglycerides 375, LDL-C 114.  History of hyperlipidemia and pacifically hypertriglyceridemia with triglycerides > 500 in the past.  He admits to dietary indiscretion including regular intake of bacon, sausage, pancakes, eggs, beef.  He is opposed to starting statin but does not give specific reason why. He prefers dietary management over statins, and dietary modifications to reduce triglycerides have been discussed. Implement dietary changes focusing on reducing intake of bacon, sausage, pancakes, and maple syrup.  Will consider other lipid-lowering therapies if lipids remain elevated after 2 months of dietary modification.  - - Recheck lipid panel in two months, including apolipoprotein B  and lipoprotein(a) - Dietary modifications encouraged to reduce intake of saturated fat, processed food, sugar and other simple carbohydrates  Type 2 diabetes mellitus   Was diagnosed with type 2 a few months ago with improvement of A1c to 5.9% with dietary modification.   -  Continue dietary modification to reduce intake of sugar and other simple carbohydrates, processed foods, and saturated fat - Management per PCP  Murmur   Systolic murmur noted in upper chest. He is asymptomatic. - Will get echocardiogram to evaluate for structural heart disease       Dispo: 3 months with me  Signed, Rosaline Bane, NP-C "

## 2024-12-10 ENCOUNTER — Encounter (HOSPITAL_BASED_OUTPATIENT_CLINIC_OR_DEPARTMENT_OTHER): Payer: Self-pay | Admitting: Nurse Practitioner

## 2024-12-10 ENCOUNTER — Ambulatory Visit (HOSPITAL_BASED_OUTPATIENT_CLINIC_OR_DEPARTMENT_OTHER): Admitting: Nurse Practitioner

## 2024-12-10 VITALS — BP 122/74 | HR 60 | Ht 68.0 in | Wt 177.0 lb

## 2024-12-10 DIAGNOSIS — R011 Cardiac murmur, unspecified: Secondary | ICD-10-CM

## 2024-12-10 DIAGNOSIS — E785 Hyperlipidemia, unspecified: Secondary | ICD-10-CM | POA: Diagnosis not present

## 2024-12-10 DIAGNOSIS — E781 Pure hyperglyceridemia: Secondary | ICD-10-CM | POA: Diagnosis not present

## 2024-12-10 DIAGNOSIS — Z7189 Other specified counseling: Secondary | ICD-10-CM | POA: Diagnosis not present

## 2024-12-10 DIAGNOSIS — R931 Abnormal findings on diagnostic imaging of heart and coronary circulation: Secondary | ICD-10-CM | POA: Diagnosis not present

## 2024-12-10 DIAGNOSIS — I10 Essential (primary) hypertension: Secondary | ICD-10-CM

## 2024-12-10 DIAGNOSIS — I2510 Atherosclerotic heart disease of native coronary artery without angina pectoris: Secondary | ICD-10-CM | POA: Diagnosis not present

## 2024-12-10 NOTE — Patient Instructions (Signed)
 Medication Instructions:   Your physician recommends that you continue on your current medications as directed. Please refer to the Current Medication list given to you today.   *If you need a refill on your cardiac medications before your next appointment, please call your pharmacy*  Lab Work:  Your physician recommends that you return for a FASTING NMR/APOLIPO B/LPA, in 2 months, fasting after midnight, a week prior to your appointment with Jeffrey.  Patient given paperwork today.   If you have labs (blood work) drawn today and your tests are completely normal, you will receive your results only by: MyChart Message (if you have MyChart) OR A paper copy in the mail If you have any lab test that is abnormal or we need to change your treatment, we will call you to review the results.  Testing/Procedures:  Your physician has requested that you have an echocardiogram. Echocardiography is a painless test that uses sound waves to create images of your heart. It provides your doctor with information about the size and shape of your heart and how well your hearts chambers and valves are working. This procedure takes approximately one hour. There are no restrictions for this procedure. Please do NOT wear cologne, aftershave, or lotions (deodorant is allowed). Please arrive 15 minutes prior to your appointment time.    Your cardiac CT will be scheduled at one of the below locations:     [facility] [address]  [place], [place] [address]   If scheduled at the [facility] at [address], please enter the parking lot using the [address] entrance and use the FREE valet service at the patient drop-off area. Enter the building and check-in with registration on the main floor.  Please follow these instructions carefully (unless otherwise directed):  An IV will be required for this test and Nitroglycerin will be given.  Hold all erectile  dysfunction medications at least 3 days (72 hrs) prior to test. (Ie viagra, cialis, sildenafil, tadalafil, etc)   On the Night Before the Test: Be sure to Drink plenty of water. Do not consume any caffeinated/decaffeinated beverages or chocolate 12 hours prior to your test. Do not take any antihistamines 12 hours prior to your test.  On the Day of the Test: Drink plenty of water until 1 hour prior to the test. Do not eat any food 1 hour prior to test. You may take your regular medications prior to the test.  Take Bisoprolol  ( Zebeta ) one (1) tablet by mouth ( 10 mg)  two hours prior to test.  After the Test: Drink plenty of water. After receiving IV contrast, you may experience a mild flushed feeling. This is normal. On occasion, you may experience a mild rash up to 24 hours after the test. This is not dangerous. If this occurs, you can take Benadryl 25 mg, Zyrtec, Claritin, or Allegra and increase your fluid intake. (Patients taking Tikosyn should avoid Benadryl, and may take Zyrtec, Claritin, or Allegra) If you experience trouble breathing, this can be serious. If it is severe call 911 IMMEDIATELY. If it is mild, please call our office.  We will call to schedule your test 2-4 weeks out understanding that some insurance companies will need an authorization prior to the service being performed.   For more information and frequently asked questions, please visit our website : [email or web address]  For non-scheduling related questions, please contact the cardiac imaging nurse navigator should you have any questions/concerns: Cardiac Imaging  Nurse Comptroller Dial: [phone]   For scheduling needs, including cancellations and rescheduling, please call Brittany, [phone].    Follow-Up: At [facility], you and your health needs are our priority.  As part of our continuing mission to provide you with exceptional heart care, our providers are all  part of one team.  This team includes your primary Cardiologist (physician) and Advanced Practice Providers or APPs (Physician Assistants and Nurse Practitioners) who all work together to provide you with the care you need, when you need it.  Your next appointment:   2 month(s)  Provider:   Rosaline Bane, NP    We recommend signing up for the patient portal called MyChart.  Sign up information is provided on this After Visit Summary.  MyChart is used to connect with patients for Virtual Visits (Telemedicine).  Patients are able to view lab/test results, encounter notes, upcoming appointments, etc.  Non-urgent messages can be sent to your provider as well.   To learn more about what you can do with MyChart, go to [email or web address].   Other Instructions  Adopting a Healthy Lifestyle.   Weight: Know what a healthy weight is for you (roughly BMI <25) and aim to maintain this. You can calculate your body mass index on your smart phone. Unfortunately, this is not the most accurate measure of healthy weight, but it is the simplest measurement to use. A more accurate measurement involves body scanning which measures lean muscle, fat tissue and bony density. We do not have this equipment at [facility].    Diet: Aim for 7+ servings of fruits and vegetables daily Limit animal fats in diet for cholesterol and heart health - choose grass fed whenever available Avoid highly processed foods (fast food burgers, tacos, fried chicken, pizza, hot dogs, french fries)  Saturated fat comes in the form of butter, lard, coconut oil, margarine, partially hydrogenated oils, dairy products, and fat in meat. These increase your risk of cardiovascular disease.  Use healthy plant oils, such as olive, canola, soy, corn, sunflower and peanut.  Whole foods such as fruits, vegetables and whole grains have fiber  Men need > 38 grams of fiber per day Women need > 25 grams of fiber per day  Load up on vegetables and  fruits - one-half of your plate: Aim for color and variety, and remember that potatoes dont count. Go for whole grains - one-quarter of your plate: Whole wheat, barley, wheat berries, quinoa, oats, brown rice, and foods made with them. If you want pasta, go with whole wheat pasta. Protein power - one-quarter of your plate: Fish, chicken, beans, and nuts are all healthy, versatile protein sources. Limit red meat. You need carbohydrates for energy! The type of carbohydrate is more important than the amount. Choose carbohydrates such as vegetables, fruits, whole grains, beans, and nuts in the place of white rice, white pasta, potatoes (baked or fried), macaroni and cheese, cakes, cookies, and donuts.  If youre thirsty, drink water. Coffee and tea are good in moderation, but skip sugary drinks and limit milk and dairy products to one or two daily servings. Keep sugar intake at 6 teaspoons or 24 grams or LESS       Exercise: Aim for 150 min of moderate intensity exercise weekly for heart health, and weights twice weekly for bone health Stay active - any steps are better than no steps! Aim for 7-9 hours of sleep daily   Sleep: This provides your body with the reset and relaxation  that it needs!  Aim to get 7-8 hours of sleep each night. Limit caffeine, screen time, and other distractions prior to bedtime.  Keep your bedroom cool and dark and do not wear heavy clothing to bed or use heavy bed covers - layer if needed.         Mediterranean Diet  Why follow it? Research shows Those who follow the Mediterranean diet have a reduced risk of heart disease  The diet is associated with a reduced incidence of Parkinson's and Alzheimer's diseases People following the diet may have longer life expectancies and lower rates of chronic diseases  The Dietary Guidelines for Americans recommends the Mediterranean diet as an eating plan to promote health and prevent disease  What Is the Mediterranean Diet?   Healthy eating plan based on typical foods and recipes of Mediterranean-style cooking The diet is primarily a plant based diet; these foods should make up a majority of meals   Starches - Plant based foods should make up a majority of meals - They are an important sources of vitamins, minerals, energy, antioxidants, and fiber - Choose whole grains, foods high in fiber and minimally processed items  - Typical grain sources include wheat, oats, barley, corn, brown rice, bulgar, farro, millet, polenta, couscous  - Various types of beans include chickpeas, lentils, fava beans, black beans, white beans   Fruits  Veggies - Large quantities of antioxidant rich fruits & veggies; 6 or more servings  - Vegetables can be eaten raw or lightly drizzled with oil and cooked  - Vegetables common to the traditional Mediterranean Diet include: artichokes, arugula, beets, broccoli, brussel sprouts, cabbage, carrots, celery, collard greens, cucumbers, eggplant, kale, leeks, lemons, lettuce, mushrooms, okra, onions, peas, peppers, potatoes, pumpkin, radishes, rutabaga, shallots, spinach, sweet potatoes, turnips, zucchini - Fruits common to the Mediterranean Diet include: apples, apricots, avocados, cherries, clementines, dates, figs, grapefruits, grapes, melons, nectarines, oranges, peaches, pears, pomegranates, strawberries, tangerines  Fats - Replace butter and margarine with healthy oils, such as olive oil, canola oil, and tahini  - Limit nuts to no more than a handful a day  - Nuts include walnuts, almonds, pecans, pistachios, pine nuts  - Limit or avoid candied, honey roasted or heavily salted nuts - Olives are central to the [employer] - can be eaten whole or used in a variety of dishes   Meats Protein - Limiting red meat: no more than a few times a month - When eating red meat: choose lean cuts and keep the portion to the size of deck of cards - Eggs: approx. 0 to 4 times a week  - Fish and lean  poultry: at least 2 a week  - Healthy protein sources include, chicken, turkey, lean beef, lamb - Increase intake of seafood such as tuna, salmon, trout, mackerel, shrimp, scallops - Avoid or limit high fat processed meats such as sausage and bacon  Dairy - Include moderate amounts of low fat dairy products  - Focus on healthy dairy such as fat free yogurt, skim milk, low or reduced fat cheese - Limit dairy products higher in fat such as whole or 2% milk, cheese, ice cream  Alcohol - Moderate amounts of red wine is ok  - No more than 5 oz daily for women (all ages) and men older than age 61  - No more than 10 oz of wine daily for men younger than 56  Other - Limit sweets and other desserts  - Use herbs and spices instead  of salt to flavor foods  - Herbs and spices common to the traditional Mediterranean Diet include: basil, bay leaves, chives, cloves, cumin, fennel, garlic, lavender, marjoram, mint, oregano, parsley, pepper, rosemary, sage, savory, sumac, tarragon, thyme   Its not just a diet, its a lifestyle:  The Mediterranean diet includes lifestyle factors typical of those in the region  Foods, drinks and meals are best eaten with others and savored Daily physical activity is important for overall good health This could be strenuous exercise like running and aerobics This could also be more leisurely activities such as walking, housework, yard-work, or taking the stairs Moderation is the key; a balanced and healthy diet accommodates most foods and drinks Consider portion sizes and frequency of consumption of certain foods   Meal Ideas & Options:  Breakfast:  Whole wheat toast or whole wheat English muffins with peanut butter & hard boiled egg Steel cut oats topped with apples & cinnamon and skim milk  Fresh fruit: banana, strawberries, melon, berries, peaches  Smoothies: strawberries, bananas, greek yogurt, peanut butter Low fat greek yogurt with blueberries and granola  Egg white  omelet with spinach and mushrooms Breakfast couscous: whole wheat couscous, apricots, skim milk, cranberries  Sandwiches:  Hummus and grilled vegetables (peppers, zucchini, squash) on whole wheat bread   Grilled chicken on whole wheat pita with lettuce, tomatoes, cucumbers or tzatziki  Tuna salad on whole wheat bread: tuna salad made with greek yogurt, olives, red peppers, capers, green onions Garlic rosemary lamb pita: lamb sauted with garlic, rosemary, salt & pepper; add lettuce, cucumber, greek yogurt to pita - flavor with lemon juice and black pepper  Seafood:  Mediterranean grilled salmon, seasoned with garlic, basil, parsley, lemon juice and black pepper Shrimp, lemon, and spinach whole-grain pasta salad made with low fat greek yogurt  Seared scallops with lemon orzo  Seared tuna steaks seasoned salt, pepper, coriander topped with tomato mixture of olives, tomatoes, olive oil, minced garlic, parsley, green onions and cappers  Meats:  Herbed greek chicken salad with kalamata olives, cucumber, feta  Red bell peppers stuffed with spinach, bulgur, lean ground beef (or lentils) & topped with feta   Kebabs: skewers of chicken, tomatoes, onions, zucchini, squash  Turkey burgers: made with red onions, mint, dill, lemon juice, feta cheese topped with roasted red peppers Vegetarian Cucumber salad: cucumbers, artichoke hearts, celery, red onion, feta cheese, tossed in olive oil & lemon juice  Hummus and whole grain pita points with a greek salad (lettuce, tomato, feta, olives, cucumbers, red onion) Lentil soup with celery, carrots made with vegetable broth, garlic, salt and pepper  Tabouli salad: parsley, bulgur, mint, scallions, cucumbers, tomato, radishes, lemon juice, olive oil, salt and pepper.

## 2024-12-17 ENCOUNTER — Other Ambulatory Visit (INDEPENDENT_AMBULATORY_CARE_PROVIDER_SITE_OTHER)

## 2024-12-17 DIAGNOSIS — I5030 Unspecified diastolic (congestive) heart failure: Secondary | ICD-10-CM | POA: Diagnosis not present

## 2024-12-17 DIAGNOSIS — R931 Abnormal findings on diagnostic imaging of heart and coronary circulation: Secondary | ICD-10-CM

## 2024-12-17 DIAGNOSIS — I517 Cardiomegaly: Secondary | ICD-10-CM | POA: Diagnosis not present

## 2024-12-17 DIAGNOSIS — R011 Cardiac murmur, unspecified: Secondary | ICD-10-CM | POA: Diagnosis not present

## 2024-12-17 DIAGNOSIS — I351 Nonrheumatic aortic (valve) insufficiency: Secondary | ICD-10-CM

## 2024-12-17 LAB — ECHOCARDIOGRAM COMPLETE
AV Vena cont: 0.47 cm
Area-P 1/2: 2.99 cm2
P 1/2 time: 515 ms
S' Lateral: 3 cm

## 2024-12-18 ENCOUNTER — Ambulatory Visit (HOSPITAL_BASED_OUTPATIENT_CLINIC_OR_DEPARTMENT_OTHER): Payer: Self-pay | Admitting: Nurse Practitioner

## 2024-12-22 ENCOUNTER — Encounter (HOSPITAL_COMMUNITY): Payer: Self-pay

## 2024-12-25 ENCOUNTER — Ambulatory Visit (HOSPITAL_COMMUNITY)
Admission: RE | Admit: 2024-12-25 | Discharge: 2024-12-25 | Disposition: A | Discharge status: Home / Self Care | Source: Ambulatory Visit | Attending: Cardiology | Admitting: Cardiology

## 2024-12-25 DIAGNOSIS — R931 Abnormal findings on diagnostic imaging of heart and coronary circulation: Secondary | ICD-10-CM | POA: Diagnosis present

## 2024-12-25 DIAGNOSIS — I700 Atherosclerosis of aorta: Secondary | ICD-10-CM | POA: Diagnosis not present

## 2024-12-25 DIAGNOSIS — R011 Cardiac murmur, unspecified: Secondary | ICD-10-CM | POA: Diagnosis present

## 2024-12-25 DIAGNOSIS — I2510 Atherosclerotic heart disease of native coronary artery without angina pectoris: Secondary | ICD-10-CM | POA: Insufficient documentation

## 2024-12-25 LAB — POCT I-STAT CREATININE: Creatinine, Ser: 1.2 mg/dL (ref 0.61–1.24)

## 2024-12-25 MED ORDER — IOHEXOL 350 MG/ML SOLN
100.0000 mL | Freq: Once | INTRAVENOUS | Status: AC | PRN
  Administered 2024-12-25: 100 mL via INTRAVENOUS

## 2024-12-25 MED ORDER — NITROGLYCERIN 0.4 MG SL SUBL
0.8000 mg | SUBLINGUAL_TABLET | Freq: Once | SUBLINGUAL | Status: AC
  Administered 2024-12-25: 0.8 mg via SUBLINGUAL

## 2024-12-27 ENCOUNTER — Other Ambulatory Visit (HOSPITAL_BASED_OUTPATIENT_CLINIC_OR_DEPARTMENT_OTHER): Payer: Self-pay | Admitting: *Deleted

## 2024-12-27 ENCOUNTER — Encounter (HOSPITAL_BASED_OUTPATIENT_CLINIC_OR_DEPARTMENT_OTHER): Payer: Self-pay

## 2024-12-27 DIAGNOSIS — R011 Cardiac murmur, unspecified: Secondary | ICD-10-CM

## 2024-12-27 DIAGNOSIS — R931 Abnormal findings on diagnostic imaging of heart and coronary circulation: Secondary | ICD-10-CM

## 2024-12-27 DIAGNOSIS — I2510 Atherosclerotic heart disease of native coronary artery without angina pectoris: Secondary | ICD-10-CM

## 2024-12-27 DIAGNOSIS — E781 Pure hyperglyceridemia: Secondary | ICD-10-CM

## 2024-12-27 DIAGNOSIS — Z7189 Other specified counseling: Secondary | ICD-10-CM

## 2024-12-27 DIAGNOSIS — E785 Hyperlipidemia, unspecified: Secondary | ICD-10-CM

## 2024-12-27 MED ORDER — ASPIRIN 81 MG PO TBEC: 81.0000 mg | DELAYED_RELEASE_TABLET | Freq: Every day | ORAL | Status: AC

## 2024-12-27 NOTE — Addendum Note (Signed)
 Addended by: PERCY ROSALINE HERO on: 12/27/2024 11:52 AM   Modules accepted: Orders

## 2024-12-28 NOTE — Addendum Note (Signed)
 Addended by: PERCY ROSALINE HERO on: 12/28/2024 08:10 AM   Modules accepted: Orders

## 2024-12-29 LAB — NMR, LIPOPROFILE
Cholesterol, Total: 199 mg/dL (ref 100–199)
HDL Particle Number: 32.5 umol/L
HDL-C: 40 mg/dL
LDL Particle Number: 1454 nmol/L — ABNORMAL HIGH
LDL Size: 20.1 nm — ABNORMAL LOW
LDL-C (NIH Calc): 113 mg/dL — ABNORMAL HIGH (ref 0–99)
LP-IR Score: 71 — ABNORMAL HIGH
Small LDL Particle Number: 978 nmol/L — ABNORMAL HIGH
Triglycerides: 264 mg/dL — ABNORMAL HIGH (ref 0–149)

## 2024-12-29 LAB — BASIC METABOLIC PANEL WITH GFR
BUN/Creatinine Ratio: 15 (ref 10–24)
BUN: 22 mg/dL (ref 8–27)
CO2: 20 mmol/L (ref 20–29)
Calcium: 9.7 mg/dL (ref 8.6–10.2)
Chloride: 100 mmol/L (ref 96–106)
Creatinine, Ser: 1.43 mg/dL — ABNORMAL HIGH (ref 0.76–1.27)
Glucose: 130 mg/dL — ABNORMAL HIGH (ref 70–99)
Potassium: 4.6 mmol/L (ref 3.5–5.2)
Sodium: 136 mmol/L (ref 134–144)
eGFR: 51 mL/min/{1.73_m2} — ABNORMAL LOW

## 2024-12-29 LAB — CBC WITH DIFFERENTIAL/PLATELET
Basophils Absolute: 0.1 10*3/uL (ref 0.0–0.2)
Basos: 1 %
EOS (ABSOLUTE): 0.2 10*3/uL (ref 0.0–0.4)
Eos: 3 %
Hematocrit: 36.2 % — ABNORMAL LOW (ref 37.5–51.0)
Hemoglobin: 12.7 g/dL — ABNORMAL LOW (ref 13.0–17.7)
Immature Grans (Abs): 0 10*3/uL (ref 0.0–0.1)
Immature Granulocytes: 0 %
Lymphocytes Absolute: 1.8 10*3/uL (ref 0.7–3.1)
Lymphs: 26 %
MCH: 33.8 pg — ABNORMAL HIGH (ref 26.6–33.0)
MCHC: 35.1 g/dL (ref 31.5–35.7)
MCV: 96 fL (ref 79–97)
Monocytes Absolute: 0.6 10*3/uL (ref 0.1–0.9)
Monocytes: 8 %
Neutrophils Absolute: 4.3 10*3/uL (ref 1.4–7.0)
Neutrophils: 61 %
Platelets: 279 10*3/uL (ref 150–450)
RBC: 3.76 x10E6/uL — ABNORMAL LOW (ref 4.14–5.80)
RDW: 12.7 % (ref 11.6–15.4)
WBC: 6.9 10*3/uL (ref 3.4–10.8)

## 2024-12-29 LAB — APOLIPOPROTEIN B: Apolipoprotein B: 97 mg/dL — ABNORMAL HIGH

## 2024-12-29 LAB — LIPOPROTEIN A (LPA): Lipoprotein (a): 29.7 nmol/L

## 2025-01-01 ENCOUNTER — Telehealth: Payer: Self-pay | Admitting: *Deleted

## 2025-01-01 NOTE — Telephone Encounter (Addendum)
 Cardiac Catheterization scheduled at [facility] for: Thursday January 03, 2025 10:30 AM Arrival time [facility] Main Entrance A at: 8:30 AM  Diet: -Nothing to eat after midnight.  Hydration: -May drink clear liquids until 2 hours before the procedure.  Approved liquids: Water , clear tea, black coffee, fruit juices-non-citric and without pulp,Gatorade, plain Jello/popsicles.   -Please drink 16 oz of water  2 hours before procedure.   Medication instructions: -Usual morning medications can be taken including aspirin  81 mg.  Plan to go home the same day, you will only stay overnight if medically necessary.  You must have responsible adult to drive you home.  Someone must be with you the first 24 hours after you arrive home.  Reviewed procedure instructions with patient.

## 2025-01-03 ENCOUNTER — Encounter (HOSPITAL_COMMUNITY): Admission: RE | Disposition: A | Payer: Self-pay | Source: Home / Self Care | Attending: Cardiovascular Disease

## 2025-01-03 ENCOUNTER — Ambulatory Visit (HOSPITAL_COMMUNITY)
Admission: RE | Admit: 2025-01-03 | Discharge: 2025-01-03 | Disposition: A | Discharge status: Home / Self Care | Attending: Cardiovascular Disease | Admitting: Cardiovascular Disease

## 2025-01-03 ENCOUNTER — Other Ambulatory Visit (HOSPITAL_COMMUNITY): Payer: Self-pay

## 2025-01-03 DIAGNOSIS — Z8249 Family history of ischemic heart disease and other diseases of the circulatory system: Secondary | ICD-10-CM | POA: Diagnosis not present

## 2025-01-03 DIAGNOSIS — I2510 Atherosclerotic heart disease of native coronary artery without angina pectoris: Secondary | ICD-10-CM | POA: Insufficient documentation

## 2025-01-03 DIAGNOSIS — Z955 Presence of coronary angioplasty implant and graft: Secondary | ICD-10-CM

## 2025-01-03 DIAGNOSIS — I25110 Atherosclerotic heart disease of native coronary artery with unstable angina pectoris: Secondary | ICD-10-CM | POA: Diagnosis not present

## 2025-01-03 DIAGNOSIS — E785 Hyperlipidemia, unspecified: Secondary | ICD-10-CM | POA: Insufficient documentation

## 2025-01-03 DIAGNOSIS — Z87891 Personal history of nicotine dependence: Secondary | ICD-10-CM | POA: Insufficient documentation

## 2025-01-03 DIAGNOSIS — E119 Type 2 diabetes mellitus without complications: Secondary | ICD-10-CM | POA: Diagnosis not present

## 2025-01-03 DIAGNOSIS — Z7982 Long term (current) use of aspirin: Secondary | ICD-10-CM | POA: Diagnosis not present

## 2025-01-03 DIAGNOSIS — Z79899 Other long term (current) drug therapy: Secondary | ICD-10-CM | POA: Diagnosis not present

## 2025-01-03 DIAGNOSIS — R931 Abnormal findings on diagnostic imaging of heart and coronary circulation: Secondary | ICD-10-CM

## 2025-01-03 DIAGNOSIS — I200 Unstable angina: Secondary | ICD-10-CM | POA: Diagnosis present

## 2025-01-03 LAB — POCT ACTIVATED CLOTTING TIME
Activated Clotting Time: 204 s
Activated Clotting Time: 255 s

## 2025-01-03 MED ORDER — ONDANSETRON HCL 4 MG/2ML IJ SOLN: 4.0000 mg | Freq: Four times a day (QID) | INTRAMUSCULAR | Status: DC | PRN

## 2025-01-03 MED ORDER — NITROGLYCERIN 0.4 MG SL SUBL
0.4000 mg | SUBLINGUAL_TABLET | SUBLINGUAL | 2 refills | Status: AC | PRN
  Filled 2025-01-03: qty 25, 8d supply, fill #0

## 2025-01-03 MED ORDER — FENTANYL CITRATE (PF) 100 MCG/2ML IJ SOLN
INTRAMUSCULAR | Status: AC
  Filled 2025-01-03: qty 2

## 2025-01-03 MED ORDER — HEPARIN (PORCINE) IN NACL 1000-0.9 UT/500ML-% IV SOLN
INTRAVENOUS | Status: DC | PRN
  Administered 2025-01-03: 1000 mL

## 2025-01-03 MED ORDER — HEPARIN SODIUM (PORCINE) 1000 UNIT/ML IJ SOLN
INTRAMUSCULAR | Status: AC
  Filled 2025-01-03: qty 10

## 2025-01-03 MED ORDER — LABETALOL HCL 5 MG/ML IV SOLN: 10.0000 mg | INTRAVENOUS | Status: DC | PRN

## 2025-01-03 MED ORDER — LIDOCAINE HCL (PF) 1 % IJ SOLN
INTRAMUSCULAR | Status: AC
  Filled 2025-01-03: qty 30

## 2025-01-03 MED ORDER — SODIUM CHLORIDE 0.9% FLUSH: 3.0000 mL | Freq: Two times a day (BID) | INTRAVENOUS | Status: DC

## 2025-01-03 MED ORDER — SODIUM CHLORIDE 0.9 % IV SOLN
INTRAVENOUS | Status: DC | PRN
  Administered 2025-01-03: 10 mL/h via INTRAVENOUS

## 2025-01-03 MED ORDER — ACETAMINOPHEN 325 MG PO TABS: 650.0000 mg | ORAL_TABLET | ORAL | Status: DC | PRN

## 2025-01-03 MED ORDER — VERAPAMIL HCL 2.5 MG/ML IV SOLN
INTRAVENOUS | Status: DC | PRN
  Administered 2025-01-03: 10 mL via INTRA_ARTERIAL

## 2025-01-03 MED ORDER — CLOPIDOGREL BISULFATE 75 MG PO TABS
75.0000 mg | ORAL_TABLET | Freq: Every day | ORAL | 1 refills | Status: AC
  Filled 2025-01-03: qty 90, 90d supply, fill #0

## 2025-01-03 MED ORDER — FAMOTIDINE IN NACL 20-0.9 MG/50ML-% IV SOLN
INTRAVENOUS | Status: AC
  Filled 2025-01-03: qty 50

## 2025-01-03 MED ORDER — FAMOTIDINE IN NACL 20-0.9 MG/50ML-% IV SOLN
INTRAVENOUS | Status: DC | PRN
  Administered 2025-01-03: 20 mg via INTRAVENOUS

## 2025-01-03 MED ORDER — LIDOCAINE HCL (PF) 1 % IJ SOLN
INTRAMUSCULAR | Status: DC | PRN
  Administered 2025-01-03: 5 mL

## 2025-01-03 MED ORDER — SODIUM CHLORIDE 0.9% FLUSH: 3.0000 mL | INTRAVENOUS | Status: DC | PRN

## 2025-01-03 MED ORDER — MIDAZOLAM HCL 2 MG/2ML IJ SOLN
INTRAMUSCULAR | Status: AC
  Filled 2025-01-03: qty 2

## 2025-01-03 MED ORDER — IOHEXOL 350 MG/ML SOLN
INTRAVENOUS | Status: DC | PRN
  Administered 2025-01-03: 135 mL

## 2025-01-03 MED ORDER — CLOPIDOGREL BISULFATE 300 MG PO TABS
ORAL_TABLET | ORAL | Status: AC
  Filled 2025-01-03: qty 2

## 2025-01-03 MED ORDER — ASPIRIN 81 MG PO CHEW: 81.0000 mg | CHEWABLE_TABLET | ORAL | Status: DC

## 2025-01-03 MED ORDER — HEPARIN SODIUM (PORCINE) 1000 UNIT/ML IJ SOLN
INTRAMUSCULAR | Status: DC | PRN
  Administered 2025-01-03: 6000 [IU] via INTRAVENOUS
  Administered 2025-01-03: 5000 [IU] via INTRAVENOUS
  Administered 2025-01-03: 4000 [IU] via INTRAVENOUS

## 2025-01-03 MED ORDER — HYDRALAZINE HCL 20 MG/ML IJ SOLN: 10.0000 mg | INTRAMUSCULAR | Status: DC | PRN

## 2025-01-03 MED ORDER — FREE WATER: 500.0000 mL | Freq: Once | Status: DC

## 2025-01-03 MED ORDER — FENTANYL CITRATE (PF) 100 MCG/2ML IJ SOLN
INTRAMUSCULAR | Status: DC | PRN
  Administered 2025-01-03: 25 ug via INTRAVENOUS
  Administered 2025-01-03: 50 ug via INTRAVENOUS

## 2025-01-03 MED ORDER — SODIUM CHLORIDE 0.9 % IV SOLN: 250.0000 mL | INTRAVENOUS | Status: DC | PRN

## 2025-01-03 MED ORDER — MIDAZOLAM HCL (PF) 2 MG/2ML IJ SOLN
INTRAMUSCULAR | Status: DC | PRN
  Administered 2025-01-03: 1 mg via INTRAVENOUS
  Administered 2025-01-03: 2 mg via INTRAVENOUS

## 2025-01-03 MED ORDER — VERAPAMIL HCL 2.5 MG/ML IV SOLN
INTRAVENOUS | Status: AC
  Filled 2025-01-03: qty 2

## 2025-01-03 MED ORDER — CLOPIDOGREL BISULFATE 300 MG PO TABS
ORAL_TABLET | ORAL | Status: DC | PRN
  Administered 2025-01-03: 600 mg via ORAL

## 2025-01-03 NOTE — Interval H&P Note (Signed)
 History and Physical Interval Note:  01/03/2025 9:45 AM  Jeffrey Wong  has presented today for surgery, with the diagnosis of abnormal ct.  The various methods of treatment have been discussed with the patient and family. After consideration of risks, benefits and other options for treatment, the patient has consented to  Procedures: LEFT HEART CATH AND CORONARY ANGIOGRAPHY (N/A) as a surgical intervention.  The patient's history has been reviewed, patient examined, no change in status, stable for surgery.  I have reviewed the patient's chart and labs.  Questions were answered to the patient's satisfaction.    Cath Lab Visit (complete for each Cath Lab visit)  Clinical Evaluation Leading to the Procedure:   ACS: No.  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

## 2025-01-03 NOTE — Progress Notes (Addendum)
 CARDIAC REHAB PHASE I     Post stent education including site care, restrictions, risk factors, exercise guidelines, NTG use, antiplatelet therapy importance, heart healthy diet, and CRP2 reviewed. All questions and concerns addressed. Will refer to [facility] for CRP2. Plan for home later today.    [id number] Jeffrey JAYSON [place], RN BSN 01/03/2025 12:09 PM

## 2025-01-03 NOTE — Discharge Summary (Signed)
 "   Discharge Summary for Same Day PCI   Patient ID: Jeffrey Wong MRN: [id number]; DOB: 07-14-1946  Admit date: 01/03/2025 Discharge date: 01/03/2025  Primary Care Provider: Arloa Elsie SAUNDERS, MD  Primary Cardiologist: None  Primary Electrophysiologist:  None   Discharge Diagnoses    Active Problems:   CAD (coronary artery disease)  Diagnostic Studies/Procedures    Cardiac Catheterization 01/03/2025:    Prox RCA lesion is 90% stenosed.   1st Mrg lesion is 90% stenosed.   Prox Cx to Mid Cx lesion is 50% stenosed.   Mid LAD lesion is 50% stenosed.   A drug-eluting stent was successfully placed using a STENT SYNERGY XD 2.75X16.   A drug-eluting stent was successfully placed using a STENT SYNERGY XD 2.50X16.   Post intervention, there is a 0% residual stenosis.   Post intervention, there is a 0% residual stenosis.   Moderate mid LAD stenosis Moderate proximal Circumflex stenosis Severe ramus intermediate stenosis Successful PTCA/DES x 1 ramus intermediate branch Severe proximal RCA stenosis Successful PTCA/DES x 1 proximal RCA   Recommendations: DAPT with ASA and Plavix  for at least six months. Same day post PCI discharge.  Diagnostic Dominance: Right  Intervention   _____________   History of Present Illness     Jeffrey Wong is a 79 y.o. male with past medical history of coronary artery disease noted on CT scan, aortic atherosclerosis, carotid artery disease, hypertension, diabetes, hyperlipidemia and prior tobacco use who underwent outpatient coronary CTA with a calcium  score of 1216, 60th percentile with significant calcification in the LAD, circumflex and RCA.  Given this finding he was set up for outpatient cardiac catheterization.  Hospital Course     The patient underwent cardiac cath as noted above with successful PCI/DES x 1 to ramus intermediate as well as DES x 1 to proximal RCA, moderate proximal circumflex stenosis and moderate mid LAD stenosis to be  treated medically.. Plan for DAPT with ASA/Plavix  for at least 6 months. The patient was seen by cardiac rehab while in short stay. There were no observed complications post cath. Radial cath site was re-evaluated prior to discharge and found to be stable without any complications. Instructions/precautions regarding cath site care were given prior to discharge.  Jeffrey Wong was seen by Dr. Verlin and determined stable for discharge home. Follow up with our office has been arranged. Medications are listed below. Pertinent changes include Plavix  and sublingual nitroglycerin .  Discussed adding statin, at this time patient continues to defer.  He will further discuss at his follow-up visit. _____________  Cath/PCI Registry Performance & Quality Measures: Aspirin  prescribed? - Yes ADP Receptor Inhibitor (Plavix /Clopidogrel , Brilinta/Ticagrelor or Effient/Prasugrel) prescribed (includes medically managed patients)? - Yes High Intensity Statin (Lipitor 40-80mg  or Crestor 20-40mg ) prescribed? - Yes For EF <40%, was ACEI/ARB prescribed? - Not Applicable (EF >/= 40%) For EF <40%, Aldosterone Antagonist (Spironolactone or Eplerenone) prescribed? - Not Applicable (EF >/= 40%) Cardiac Rehab Phase II ordered (Included Medically managed Patients)? - Yes  _____________   Discharge Vitals Blood pressure (!) 125/59, pulse (!) 53, temperature (!) 97.5 F (36.4 C), temperature source Oral, resp. rate (!) 22, height 5' 8 (1.727 m), weight 77.1 kg, SpO2 96%.  Filed Weights   01/03/25 0924  Weight: 77.1 kg    Last Labs & Radiologic Studies    CBC No results for input(s): WBC, NEUTROABS, HGB, HCT, MCV, PLT in the last 72 hours. Basic Metabolic Panel No results for input(s): NA, K, CL, CO2,  GLUCOSE, BUN, CREATININE, CALCIUM , MG, PHOS in the last 72 hours. Liver Function Tests No results for input(s): AST, ALT, ALKPHOS, BILITOT, PROT, ALBUMIN in the last 72  hours. No results for input(s): LIPASE, AMYLASE in the last 72 hours. High Sensitivity Troponin:   No results for input(s): TROPONINIHS in the last 720 hours.  BNP Invalid input(s): POCBNP D-Dimer No results for input(s): DDIMER in the last 72 hours. Hemoglobin A1C No results for input(s): HGBA1C in the last 72 hours. Fasting Lipid Panel No results for input(s): CHOL, HDL, LDLCALC, TRIG, CHOLHDL, LDLDIRECT in the last 72 hours. Thyroid  Function Tests No results for input(s): TSH, T4TOTAL, T3FREE, THYROIDAB in the last 72 hours.  Invalid input(s): FREET3 _____________  CARDIAC CATHETERIZATION Result Date: 01/03/2025   Prox RCA lesion is 90% stenosed.   1st Mrg lesion is 90% stenosed.   Prox Cx to Mid Cx lesion is 50% stenosed.   Mid LAD lesion is 50% stenosed.   A drug-eluting stent was successfully placed using a STENT SYNERGY XD 2.75X16.   A drug-eluting stent was successfully placed using a STENT SYNERGY XD 2.50X16.   Post intervention, there is a 0% residual stenosis.   Post intervention, there is a 0% residual stenosis. Moderate mid LAD stenosis Moderate proximal Circumflex stenosis Severe ramus intermediate stenosis Successful PTCA/DES x 1 ramus intermediate branch Severe proximal RCA stenosis Successful PTCA/DES x 1 proximal RCA Recommendations: DAPT with ASA and Plavix  for at least six months. Same day post PCI discharge.   CT CORONARY MORPH W/CTA COR W/SCORE W/CA W/CM &/OR WO/CM Addendum Date: 12/30/2024 ADDENDUM REPORT: 12/30/2024 00:01 EXAM: OVER-READ INTERPRETATION  CT CHEST The following report is an over-read performed by radiologist Dr. Oneil Devonshire of [facility] on 12/30/2024. This over-read does not include interpretation of cardiac or coronary anatomy or pathology. The coronary calcium  score/coronary CTA interpretation by the cardiologist is attached. COMPARISON:  05/07/2024 FINDINGS: Cardiovascular: Atherosclerotic calcifications of  the aorta are noted. No aneurysmal dilatation or dissection is seen. Mediastinum/Nodes: There are no enlarged lymph nodes within the visualized mediastinum. Lungs/Pleura: There is no pleural effusion. The visualized lungs appear clear. Upper abdomen: No significant findings in the visualized upper abdomen. Musculoskeletal/Chest wall: No chest wall mass or suspicious osseous findings within the visualized chest. IMPRESSION: Aortic Atherosclerosis (ICD10-I70.0). Electronically Signed   By: Oneil Devonshire M.D.   On: 12/30/2024 00:01   Result Date: 12/30/2024 CLINICAL DATA:  Chest pain EXAM: Cardiac CTA MEDICATIONS: Sub lingual nitro. 4mg  x 2 TECHNIQUE: A non-contrast, gated CT scan was obtained with axial slices of 2.5 mm through the heart for calcium  scoring. Calcium  scoring was performed using the Agatston method. A 120 kV prospective, gated, contrast cardiac CT scan was obtained. Gantry rotation speed was 230 msec and collimation was 0.63 mm. Two sublingual nitroglycerin  tablets (0.8 mg) were given. The 3D data set was reconstructed with motion correction for the best systolic or diastolic phase. Images were analyzed on a dedicated workstation using MPR, MIP, and VRT modes. The patient received 95 cc contrast. FINDINGS: Non-cardiac: See separate report from [facility]. Normal caliber aortic root and ascending aorta. Pulmonary veins drain normally to the left atrium, no LA appendage thrombus. Calcium  Score: 1182 AU Coronary Arteries: Right dominant with no anomalies LM: Mixed plaque, minimal stenosis. LAD system: Calcified plaque proximal LAD, mild (25-49%) stenosis. Noncalcified plaque in the mid LAD after D2, severe (70-99%) stenosis. The stenosis visually appears closer to 70%. CT FFR 0.73 in the distal LAD, this suggests  that the mid LAD stenosis is hemodynamically significant. Small caliber, diffusely diseased D1 and D2. Circumflex system: Moderate ramus, visually appears to have severe stenosis in the  proximal vessel with mixed plaque (70-99%). CT FFR 0.76 mid ramus suggests borderline hemodynamic significance. Calcified plaque ostial LCx, visually moderate (50-69%) stenosis, not significant by CT FFR 0.81 in the proximal LCx but by the distal LCx, FFR is 0.7 which suggests hemodynamically significant disease. There is only minimal plaque in the remainder of the LCx, so it looks like FFR decrements the length of the LCx. Would still rate ostial LCx stenosis of borderline significance. RCA system: Mixed plaque proximal RCA with severe (70-99%) stenosis. CT FFR 0.7 mid RCA suggests hemodynamically significant stenosis. IMPRESSION: 1. Coronary artery calcium  score 1182 AU. This places the patient in the 79th percentile for age and gender, suggesting intermediate to high risk for future cardiac events. 2. Suspect hemodynamically significant stenosis in the mid LAD by CT FFR. 3. Severe proximal RCA stenosis appears hemodynamically significant by CT FFR. 4. Moderate ramus with visual severe stenosis proximally, borderline hemodynamic significance by CT FFR. 5. Moderate stenosis in the ostial LCx visually, borderline hemodynamic significance by CT FFR. Recommend cardiac cath. [employer] Electronically Signed: By: Ezra Shuck M.D. On: 12/25/2024 16:36   ECHOCARDIOGRAM COMPLETE Result Date: 12/17/2024    ECHOCARDIOGRAM REPORT   Patient Name:   Jeffrey Wong Date of Exam: 12/17/2024 Medical Rec #:  [id number]         Height:       68.0 in Accession #:    [id number]        Weight:       177.0 lb Date of Birth:  04/25/1946          BSA:          1.940 m Patient Age:    78 years          BP:           122/74 mmHg Patient Gender: M                 HR:           66 bpm. Exam Location:  Outpatient Procedure: 2D Echo, 3D Echo, Cardiac Doppler, Color Doppler and Strain Analysis            (Both Spectral and Color Flow Doppler were utilized during            procedure). Indications:    Murmur  History:        Patient has  no prior history of Echocardiogram examinations.                 CAD; Risk Factors:Hypertension, Former Smoker, Diabetes and                 Dyslipidemia.  Sonographer:    Orvil Holmes RDCS Referring Phys: [phone] ROSALINE HERO SWINYER IMPRESSIONS  1. Left ventricular ejection fraction, by estimation, is 55 to 60%. The left ventricle has normal function. The left ventricle has no regional wall motion abnormalities. There is mild concentric left ventricular hypertrophy. Left ventricular diastolic parameters are consistent with Grade I diastolic dysfunction (impaired relaxation). The average left ventricular global longitudinal strain is -20.1 %. The global longitudinal strain is normal.  2. Right ventricular systolic function is normal. The right ventricular size is mildly enlarged. Tricuspid regurgitation signal is inadequate for assessing PA pressure.  3. Left atrial size was mildly dilated.  4. The mitral  valve is normal in structure. Trivial mitral valve regurgitation. No evidence of mitral stenosis.  5. The aortic valve is tricuspid. Aortic valve regurgitation is mild. No aortic stenosis is present.  6. The inferior vena cava is normal in size with greater than 50% respiratory variability, suggesting right atrial pressure of 3 mmHg. Comparison(s): No prior Echocardiogram. Conclusion(s)/Recommendation(s): Mild aortic regurgitation. FINDINGS  Left Ventricle: Left ventricular ejection fraction, by estimation, is 55 to 60%. The left ventricle has normal function. The left ventricle has no regional wall motion abnormalities. The average left ventricular global longitudinal strain is -20.1 %. Strain was performed and the global longitudinal strain is normal. 3D ejection fraction reviewed and evaluated as part of the interpretation. Alternate measurement of EF is felt to be most reflective of LV function. The left ventricular internal cavity size was normal in size. There is mild concentric left ventricular hypertrophy.  Left ventricular diastolic parameters are consistent with Grade I diastolic dysfunction (impaired relaxation). Right Ventricle: The right ventricular size is mildly enlarged. No increase in right ventricular wall thickness. Right ventricular systolic function is normal. Tricuspid regurgitation signal is inadequate for assessing PA pressure. Left Atrium: Left atrial size was mildly dilated. Right Atrium: Right atrial size was normal in size. Pericardium: There is no evidence of pericardial effusion. Mitral Valve: The mitral valve is normal in structure. Trivial mitral valve regurgitation. No evidence of mitral valve stenosis. Tricuspid Valve: The tricuspid valve is normal in structure. Tricuspid valve regurgitation is not demonstrated. No evidence of tricuspid stenosis. Aortic Valve: The aortic valve is tricuspid. Aortic valve regurgitation is mild. Aortic regurgitation PHT measures 515 msec. No aortic stenosis is present. Pulmonic Valve: The pulmonic valve was normal in structure. Pulmonic valve regurgitation is not visualized. No evidence of pulmonic stenosis. Aorta: The aortic root and ascending aorta are structurally normal, with no evidence of dilitation. Venous: The inferior vena cava is normal in size with greater than 50% respiratory variability, suggesting right atrial pressure of 3 mmHg. IAS/Shunts: No atrial level shunt detected by color flow Doppler.  LEFT VENTRICLE PLAX 2D LVIDd:         4.43 cm   Diastology LVIDs:         3.00 cm   LV e' medial:    5.98 cm/s LV PW:         1.27 cm   LV E/e' medial:  10.8 LV IVS:        1.09 cm   LV e' lateral:   10.40 cm/s LVOT diam:     2.10 cm   LV E/e' lateral: 6.2 LV SV:         53 LV SV Index:   27        2D Longitudinal Strain LVOT Area:     3.46 cm  2D Strain GLS (A4C):   -18.1 %                          2D Strain GLS (A3C):   -19.2 %                          2D Strain GLS (A2C):   -23.1 %                          2D Strain GLS Avg:     -20.1 %  3D Volume EF:                          3D EF:        51 %                          LV EDV:       122 ml                          LV ESV:       59 ml                          LV SV:        62 ml RIGHT VENTRICLE RV Basal diam:  4.78 cm RV Mid diam:    4.38 cm RV S prime:     13.20 cm/s TAPSE (M-mode): 2.8 cm LEFT ATRIUM             Index        RIGHT ATRIUM           Index LA diam:        4.90 cm 2.53 cm/m   RA Area:     18.50 cm LA Vol (A2C):   58.9 ml 30.36 ml/m  RA Volume:   62.10 ml  32.00 ml/m LA Vol (A4C):   76.0 ml 39.17 ml/m LA Biplane Vol: 69.9 ml 36.02 ml/m  AORTIC VALVE LVOT Vmax:         65.80 cm/s LVOT Vmean:        43.400 cm/s LVOT VTI:          0.154 m AI PHT:            515 msec AR Vena Contracta: 0.47 cm  AORTA Ao Root diam: 3.20 cm Ao Asc diam:  3.60 cm MITRAL VALVE MV Area (PHT): 2.99 cm    SHUNTS MV Decel Time: 254 msec    Systemic VTI:  0.15 m MV E velocity: 64.50 cm/s  Systemic Diam: 2.10 cm MV A velocity: 90.60 cm/s MV E/A ratio:  0.71 Georganna Archer Electronically signed by Georganna Archer Signature Date/Time: 12/17/2024/5:07:56 PM    Final     Disposition   Pt is being discharged home today in good condition.  Follow-up Plans & Appointments     Discharge Instructions     Amb Referral to Cardiac Rehabilitation   Complete by: As directed    Diagnosis: Coronary Stents   After initial evaluation and assessments completed: Virtual Based Care may be provided alone or in conjunction with Phase 2 Cardiac Rehab based on patient barriers.: Yes   Intensive Cardiac Rehabilitation (ICR) [facility] location only OR Traditional Cardiac Rehabilitation (TCR) *If criteria for ICR are not met will enroll in TCR [facility] only): Yes        Discharge Medications   Allergies as of 01/03/2025   No Known Allergies      Medication List     TAKE these medications    Apple Cider Vinegar-Ginger 500-5 MG Chew Chew 1 each by mouth daily. Gummies with mother   aspirin  EC 81 MG  tablet Take 1 tablet (81 mg total) by mouth daily. Swallow whole.   bisoprolol  5 MG tablet Commonly known as: ZEBETA  Take 5 mg by mouth See admin instructions. Takes 1 whole tablet in the morning and 1/2  tablet at night What changed: additional instructions   bisoprolol  10 MG tablet Commonly known as: ZEBETA  Take 10 mg by mouth daily. What changed:  when to take this additional instructions   clopidogrel  75 MG tablet Commonly known as: Plavix  Take 1 tablet (75 mg total) by mouth daily.   cyanocobalamin  1000 MCG tablet Commonly known as: VITAMIN B12 Take 1,000 mcg by mouth daily.   diphenhydramine-acetaminophen  25-500 MG Tabs tablet Commonly known as: TYLENOL  PM Take 1 tablet by mouth at bedtime as needed (sleep). Alternates with Relaxium sleep   JUICE PLUS FIBRE PO Take 2 capsules by mouth 2 (two) times daily. 2 capsules of vegetable blend 2 capsules of fruit blend   NATTOKINASE PO Take 200 mg by mouth daily.   nitroGLYCERIN  0.4 MG SL tablet Commonly known as: Nitrostat  Place 1 tablet (0.4 mg total) under the tongue every 5 (five) minutes as needed.   OVER THE COUNTER MEDICATION Take 1 tablet by mouth daily. Beet Root with nitrates + nitric oxide   OVER THE COUNTER MEDICATION Take 1 capsule by mouth daily. Glucoguard   OVER THE COUNTER MEDICATION Take 1 each by mouth daily. Elderberry + Zinc Gummies   OVER THE COUNTER MEDICATION Take 1-2 capsules by mouth at bedtime as needed (sleep). Relaxium Sleep Alternates with tylenol  PM   OVER THE COUNTER MEDICATION Take by mouth daily. Beet juice (6-8oz) + Bragg's apple cider vinegar (2 oz)   pantoprazole  40 MG tablet Commonly known as: Protonix  Take 1 tablet (40 mg total) by mouth daily. Take 30-60 min before first meal of the day What changed:  when to take this reasons to take this   PSYLLIUM HUSK PO Take 1 Dose by mouth daily. 0.25 of a teaspoon   PUMPKIN SEED OIL PO Take 1 capsule by mouth daily.    TURMERIC-GINGER PO Take 2 tablets by mouth daily.         Allergies Allergies[1]  Outstanding Labs/Studies   N/a   Duration of Discharge Encounter   Greater than 30 minutes including physician time.  Signed, Manuelita Rummer, NP 01/03/2025, 1:58 PM     [1] No Known Allergies  "

## 2025-01-03 NOTE — Discharge Instructions (Signed)

## 2025-01-04 ENCOUNTER — Encounter (HOSPITAL_COMMUNITY): Payer: Self-pay | Admitting: Cardiovascular Disease

## 2025-01-07 ENCOUNTER — Encounter (HOSPITAL_BASED_OUTPATIENT_CLINIC_OR_DEPARTMENT_OTHER): Payer: Self-pay

## 2025-01-09 ENCOUNTER — Telehealth (HOSPITAL_COMMUNITY): Payer: Self-pay

## 2025-01-09 NOTE — Telephone Encounter (Signed)
 Attempted to call patient in regards to Cardiac Rehab - LM on VM

## 2025-01-09 NOTE — Telephone Encounter (Signed)
 Pt insurance is active and benefits verified through [facility] Co-pay $50, DED 0/0 met, out of pocket $3,950/$10 met, co-insurance 0%. no pre-authorization required, Passport 01/09/2025@2 :01, REF# [id number]   TCR/ICR? ICR Visit(date of service)limitation? No limit Can multiple codes be used on the same date of service/visit?(IF ITS A LIMIT) yes    Is this a lifetime maximum or an annual maximum? annual Has the member used any of these services to date? no Is there a time limit (weeks/months) on start of program and/or program completion? no   Will contact patient to see if he is interested in the Cardiac Rehab Program. If interested, patient will need to complete follow up appt. Once completed, patient will be contacted for scheduling upon review by the RN Navigator.

## 2025-01-21 ENCOUNTER — Ambulatory Visit (HOSPITAL_BASED_OUTPATIENT_CLINIC_OR_DEPARTMENT_OTHER): Admitting: Nurse Practitioner
